# Patient Record
Sex: Female | Born: 1970 | Hispanic: No | Marital: Single | Smoking: Former smoker
Health system: Southern US, Community
[De-identification: ages and names within clinical notes are randomized; demographics above are authoritative.]

## PROBLEM LIST (undated history)

## (undated) DIAGNOSIS — I10 Essential (primary) hypertension: Secondary | ICD-10-CM

## (undated) DIAGNOSIS — T7840XA Allergy, unspecified, initial encounter: Secondary | ICD-10-CM

## (undated) DIAGNOSIS — K529 Noninfective gastroenteritis and colitis, unspecified: Secondary | ICD-10-CM

## (undated) DIAGNOSIS — K5190 Ulcerative colitis, unspecified, without complications: Secondary | ICD-10-CM

## (undated) DIAGNOSIS — K648 Other hemorrhoids: Secondary | ICD-10-CM

## (undated) DIAGNOSIS — K5090 Crohn's disease, unspecified, without complications: Secondary | ICD-10-CM

## (undated) DIAGNOSIS — D649 Anemia, unspecified: Secondary | ICD-10-CM

## (undated) DIAGNOSIS — K633 Ulcer of intestine: Secondary | ICD-10-CM

## (undated) HISTORY — DX: Essential (primary) hypertension: I10

## (undated) HISTORY — DX: Ulcer of intestine: K63.3

## (undated) HISTORY — PX: TONSILLECTOMY: SUR1361

## (undated) HISTORY — DX: Other hemorrhoids: K64.8

## (undated) HISTORY — DX: Allergy, unspecified, initial encounter: T78.40XA

## (undated) HISTORY — DX: Crohn's disease, unspecified, without complications: K50.90

## (undated) HISTORY — PX: COLONOSCOPY: SHX174

## (undated) HISTORY — DX: Anemia, unspecified: D64.9

## (undated) HISTORY — DX: Ulcerative colitis, unspecified, without complications: K51.90

## (undated) HISTORY — DX: Noninfective gastroenteritis and colitis, unspecified: K52.9

---

## 2013-09-12 ENCOUNTER — Encounter: Payer: Self-pay | Admitting: Internal Medicine

## 2013-10-08 ENCOUNTER — Encounter: Payer: Self-pay | Admitting: Internal Medicine

## 2013-10-09 ENCOUNTER — Ambulatory Visit (INDEPENDENT_AMBULATORY_CARE_PROVIDER_SITE_OTHER): Payer: BC Managed Care – PPO | Admitting: Internal Medicine

## 2013-10-09 ENCOUNTER — Encounter: Payer: Self-pay | Admitting: Internal Medicine

## 2013-10-09 VITALS — BP 150/84 | HR 90 | Ht 67.0 in | Wt 157.0 lb

## 2013-10-09 DIAGNOSIS — Z23 Encounter for immunization: Secondary | ICD-10-CM

## 2013-10-09 DIAGNOSIS — K529 Noninfective gastroenteritis and colitis, unspecified: Secondary | ICD-10-CM

## 2013-10-09 DIAGNOSIS — K5289 Other specified noninfective gastroenteritis and colitis: Secondary | ICD-10-CM

## 2013-10-09 DIAGNOSIS — K5010 Crohn's disease of large intestine without complications: Secondary | ICD-10-CM

## 2013-10-09 MED ORDER — PNEUMOCOCCAL VAC POLYVALENT 25 MCG/0.5ML IJ INJ: 0.5000 mL | INJECTION | Freq: Once | INTRAMUSCULAR | Status: AC

## 2013-10-09 MED ORDER — MESALAMINE 1.2 G PO TBEC: 4.8000 g | DELAYED_RELEASE_TABLET | Freq: Every day | ORAL | Status: DC

## 2013-10-09 NOTE — Progress Notes (Signed)
Patient ID: Julie Diaz, female   DOB: 01-Sep-1971, 42 y.o.   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 She is here alone today. She reports she was diagnosed in 2001, initially was ulcerative colitis. At this time she was quite ill with abdominal pain, bloody diarrhea, joint pains, erythema nodosum, and anemia.  She recalls being treated initially with Asacol and prednisone, eventually with methotrexate, and then most recently with Lialda 4.8 g daily. She reports improvement initially was slow it took several cycles of prednisone, but over the last 5-6 years she has done well.   She reports currently she is feeling normal with out symptoms of colitis. She denies abdominal pain. No diarrhea or constipation. No blood in her stool or melena. No joint pains, eye complaints, skin rashes, or oral ulcers. No nausea or vomiting. Appetite is good. No weight loss. Energy level is good. She works in Programmer, applications for [employer].  she did move from [place] where she was cared for by Dr. Mosie Epstein, MD.  Her last colonoscopy was in May of 2014  Past Medical History  Diagnosis Date  . Ulcerative colitis   . Crohn's disease     ?    Past Surgical History  Procedure Laterality Date  . Tonsillectomy      Current Outpatient Prescriptions  Medication Sig Dispense Refill  . Cholecalciferol (VITAMIN D) 2000 UNITS tablet Take 2,000 Units by mouth daily.      Marland Kitchen lisinopril (PRINIVIL,ZESTRIL) 10 MG tablet Take 10 mg by mouth daily.      . mesalamine (LIALDA) 1.2 G EC tablet Take 4.8 g by mouth daily with breakfast.      . norethindrone (MICRONOR,CAMILA,ERRIN) 0.35 MG tablet Take 1 tablet by mouth daily.       No current facility-administered medications for this visit.    No Known Allergies  Family History  Problem Relation Age of Onset  . Breast  cancer Paternal Grandmother   . Prostate cancer Maternal Grandfather     History  Substance Use Topics  . Smoking status: Never Smoker   . Smokeless tobacco: Never Used  . Alcohol Use: Yes     Comment: occasional    ROS: As per history of present illness, otherwise negative  BP 150/84  Pulse 90  Ht 5' 7"  (1.702 m)  Wt 157 lb (71.215 kg)  BMI 24.58 kg/m2  LMP 09/24/2013 Constitutional: Well-developed and well-nourished. No distress. HEENT: Normocephalic and atraumatic. Oropharynx is clear and moist. No oropharyngeal exudate. Conjunctivae are normal.  No scleral icterus. Neck: Neck supple. Trachea midline. Cardiovascular: Normal rate, regular rhythm and intact distal pulses. No M/R/G Pulmonary/chest: Effort normal and breath sounds normal. No wheezing, rales or rhonchi. Abdominal: Soft, nontender, nondistended. Bowel sounds active throughout. There are no masses palpable. No hepatosplenomegaly. Extremities: no clubbing, cyanosis, or edema Lymphadenopathy: No cervical adenopathy noted. Neurological: Alert and oriented to person place and time. Skin: Skin is warm and dry. No rashes noted. Psychiatric: Normal mood and affect. Behavior is normal.  RELEVANT LABS AND Procedures Colonoscopy 03/07/2013 -- diffuse mild colitis with nodular erosions, greatest in the right colon. TI normal. Consistent with IBD, favor Crohn's. Mild mucosal scarring in the rectum. Multiple biopsies from the whole colon.  Pathology = cecum normal, ascending colon inactive chronic colitis, transverse colon inactive chronic colitis, descending colon mildly active  chronic colitis, sigmoid colon mildly active chronic colitis, rectum inactive chronic proctitis.  Labs dated 02/03/2013 -- WBC 7.0, hemoglobin 11.4, hematocrit 36.6, MCV 93, platelet count 347 Hepatic function panel within normal limits Vitamin D, 25 =33.4 C. 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  IBD, indeterminate, most likely Crohn's colitis -- she is here today to establish care for her known history of inflammatory bowel disease. Her disease continues to be in clinical remission on Lialda 4.8 g daily. We will continue Lialda 4.8 g daily going forward. We reviewed the colonoscopy from last May together which showed mostly inactive chronic colitis though there was some mild disease activity in the left colon. Given her clinical remission, we will not escalate therapy at this time. I recommended surveillance colonoscopy with biopsies every 10 cm every 1-2 years. Given that she had some mild disease activity we will target one year with a repeat colonoscopy in May or June of 2015.  We will repeat labs in April 2015, one year from her previous lab studies. --She has received flu vaccine already this year --She has never had Pneumovax, and will be given today --Bone density scan performed 2012 -- no prednisone since this scan, therefore will not repeat at this time. She is not yet menopausal --Labs in April 2015--CMP, CBC, ferritin, CRP, B12, vitamin D  Return in one year, sooner if necessary. Colonoscopy planned for June 2015

## 2013-10-09 NOTE — Patient Instructions (Signed)
We have sent the following medications to your pharmacy for you to pick up at your convenience: continue taking Lialda 4.8g daily  You are due for lab in April 2015. You can stop down to our lab in early April at your convenience to have them drawn.  You are due to a colonoscopy in June 2015.  We will mail you out a letter when the date draws closer to remind you.   You were given a Pneumovax today                                               We are excited to introduce MyChart, a new best-in-class service that provides you online access to important information in your electronic medical record. We want to make it easier for you to view your health information - all in one secure location - when and where you need it. We expect MyChart will enhance the quality of care and service we provide.  When you register for MyChart, you can:    View your test results.    Request appointments and receive appointment reminders via email.    Request medication renewals.    View your medical history, allergies, medications and immunizations.    Communicate with your physician's office through a password-protected site.    Conveniently print information such as your medication lists.  To find out if MyChart is right for you, please talk to a member of our clinical staff today. We will gladly answer your questions about this free health and wellness tool.  If you are age 41 or older and want a member of your family to have access to your record, you must provide written consent by completing a proxy form available at our office. Please speak to our clinical staff about guidelines regarding accounts for patients younger than age 2.  As you activate your MyChart account and need any technical assistance, please call the MyChart technical support line at [phone]) or email your question to [email or web address]. If you email your question(s), please include your name, a return  phone number and the best time to reach you.  If you have non-urgent health-related questions, you can send a message to our office through [place] at [email or web address]. If you have a medical emergency, call 911.  Thank you for using MyChart as your new health and wellness resource!   MyChart licensed from [employer],  1999-2010. Patents Pending.

## 2013-12-08 ENCOUNTER — Other Ambulatory Visit: Payer: Self-pay | Admitting: Internal Medicine

## 2014-03-08 ENCOUNTER — Other Ambulatory Visit: Payer: Self-pay | Admitting: Internal Medicine

## 2014-03-26 ENCOUNTER — Other Ambulatory Visit (INDEPENDENT_AMBULATORY_CARE_PROVIDER_SITE_OTHER): Payer: BC Managed Care – PPO

## 2014-03-26 DIAGNOSIS — K5289 Other specified noninfective gastroenteritis and colitis: Secondary | ICD-10-CM

## 2014-03-26 DIAGNOSIS — K529 Noninfective gastroenteritis and colitis, unspecified: Secondary | ICD-10-CM

## 2014-03-26 LAB — HIGH SENSITIVITY CRP: CRP HIGH SENSITIVITY: 0.8 mg/L (ref 0.000–5.000)

## 2014-03-26 LAB — COMPREHENSIVE METABOLIC PANEL
ALBUMIN: 4.1 g/dL (ref 3.5–5.2)
ALT: 37 U/L — AB (ref 0–35)
AST: 23 U/L (ref 0–37)
Alkaline Phosphatase: 65 U/L (ref 39–117)
BUN: 11 mg/dL (ref 6–23)
CALCIUM: 9.6 mg/dL (ref 8.4–10.5)
CHLORIDE: 106 meq/L (ref 96–112)
CO2: 27 mEq/L (ref 19–32)
Creatinine, Ser: 0.7 mg/dL (ref 0.4–1.2)
GFR: 93.61 mL/min (ref >60.00)
Glucose, Bld: 92 mg/dL (ref 70–99)
Potassium: 3.6 mEq/L (ref 3.5–5.1)
SODIUM: 139 meq/L (ref 135–145)
TOTAL PROTEIN: 7.2 g/dL (ref 6.0–8.3)
Total Bilirubin: 0.8 mg/dL (ref 0.2–1.2)

## 2014-03-26 LAB — CBC
HCT: 39.1 % (ref 36.0–46.0)
HEMOGLOBIN: 12.9 g/dL (ref 12.0–15.0)
MCHC: 32.8 g/dL (ref 30.0–36.0)
MCV: 96.2 fl (ref 78.0–100.0)
Platelets: 286 10*3/uL (ref 150.0–400.0)
RBC: 4.07 Mil/uL (ref 3.87–5.11)
RDW: 13.5 % (ref 11.5–15.5)
WBC: 11.4 10*3/uL — AB (ref 4.0–10.5)

## 2014-03-26 LAB — VITAMIN B12: Vitamin B-12: 567 pg/mL (ref 211–911)

## 2014-03-26 LAB — FERRITIN: Ferritin: 131.4 ng/mL (ref 10.0–291.0)

## 2014-03-27 LAB — VITAMIN D 25 HYDROXY (VIT D DEFICIENCY, FRACTURES): Vit D, 25-Hydroxy: 62 ng/mL (ref 30–89)

## 2014-04-08 ENCOUNTER — Other Ambulatory Visit: Payer: Self-pay | Admitting: Internal Medicine

## 2014-04-09 ENCOUNTER — Encounter: Payer: Self-pay | Admitting: Internal Medicine

## 2014-04-10 ENCOUNTER — Ambulatory Visit (INDEPENDENT_AMBULATORY_CARE_PROVIDER_SITE_OTHER): Payer: BC Managed Care – PPO | Admitting: Internal Medicine

## 2014-04-10 ENCOUNTER — Encounter: Payer: Self-pay | Admitting: Internal Medicine

## 2014-04-10 VITALS — BP 152/90 | HR 88 | Ht 67.0 in | Wt 150.1 lb

## 2014-04-10 DIAGNOSIS — K529 Noninfective gastroenteritis and colitis, unspecified: Secondary | ICD-10-CM

## 2014-04-10 DIAGNOSIS — K5010 Crohn's disease of large intestine without complications: Secondary | ICD-10-CM

## 2014-04-10 DIAGNOSIS — K5289 Other specified noninfective gastroenteritis and colitis: Secondary | ICD-10-CM

## 2014-04-10 MED ORDER — MESALAMINE 1.2 G PO TBEC: DELAYED_RELEASE_TABLET | ORAL | Status: DC

## 2014-04-10 MED ORDER — PREPOPIK 10-3.5-12 MG-GM-GM PO PACK: 1.0000 | PACK | ORAL | Status: DC

## 2014-04-10 NOTE — Patient Instructions (Signed)
You have been scheduled for a colonoscopy with propofol. Please follow written instructions given to you at your visit today.  Please pick up your prep kit at the pharmacy within the next 1-3 days. If you use inhalers (even only as needed), please bring them with you on the day of your procedure. Your physician has requested that you go to www.startemmi.com and enter the access code given to you at your visit today. This web site gives a general overview about your procedure. However, you should still follow specific instructions given to you by our office regarding your preparation for the procedure.  We have sent the following medications to your pharmacy for you to pick up at your convenience: Lialda 4 tablets daily  CC:Dr Glendale Chard

## 2014-04-10 NOTE — Progress Notes (Signed)
Subjective:    Patient ID: Lana Fish, female    DOB: 1971-02-08, 43 y.o.   MRN: [id number]  HPI Yamira Papa is a 43 year old female with a past medical history of pan colonic Crohn's who is seen for followup. She was diagnosed in 2001, initially as ulcerative colitis. She has been on Lialda 4.8 g daily for sometime. Today she reports she is doing well. She denies abdominal pain. She is having 1-2 formed brown stools daily without blood in her stool or melena. She reports a good appetite without nausea or vomiting. No heartburn. No hepatobiliary complaints. She denies oral ulcers, eye pain, rashes or joint symptoms. She has intentionally lost about 7 pounds which she is happy with. She does take daily vitamin D and also lisinopril for hypertension.   Review of Systems As per history of present illness, otherwise negative  Current Medications, Allergies, Past Medical History, Past Surgical History, Family History and Social History were reviewed in [employer] [employer] record.     Objective:   Physical Exam BP 152/90  Pulse 88  Ht 5' 7"  (1.702 m)  Wt 150 lb 2 oz (68.096 kg)  BMI 23.51 kg/m2  LMP 03/14/2014 Constitutional: Well-developed and well-nourished. No distress. HEENT: Normocephalic and atraumatic. Oropharynx is clear and moist. No oropharyngeal exudate. Conjunctivae are normal.  No scleral icterus. Neck: Neck supple. Trachea midline. Cardiovascular: Normal rate, regular rhythm and intact distal pulses. No M/R/G Pulmonary/chest: Effort normal and breath sounds normal. No wheezing, rales or rhonchi. Abdominal: Soft, nontender, nondistended. Bowel sounds active throughout. There are no masses palpable. No hepatosplenomegaly. Extremities: no clubbing, cyanosis, or edema Lymphadenopathy: No cervical adenopathy noted. Neurological: Alert and oriented to person place and time. Skin: Skin is warm and dry. No rashes noted. Psychiatric: Normal mood and affect. Behavior  is normal.  CBC    Component Value Date/Time   WBC 11.4* 03/26/2014 0928   RBC 4.07 03/26/2014 0928   HGB 12.9 03/26/2014 0928   HCT 39.1 03/26/2014 0928   PLT 286.0 03/26/2014 0928   MCV 96.2 03/26/2014 0928   MCHC 32.8 03/26/2014 0928   RDW 13.5 03/26/2014 0928    CMP     Component Value Date/Time   NA 139 03/26/2014 0928   K 3.6 03/26/2014 0928   CL 106 03/26/2014 0928   CO2 27 03/26/2014 0928   GLUCOSE 92 03/26/2014 0928   BUN 11 03/26/2014 0928   CREATININE 0.7 03/26/2014 0928   CALCIUM 9.6 03/26/2014 0928   PROT 7.2 03/26/2014 0928   ALBUMIN 4.1 03/26/2014 0928   AST 23 03/26/2014 0928   ALT 37* 03/26/2014 0928   ALKPHOS 65 03/26/2014 0928   BILITOT 0.8 03/26/2014 0928    Iron/TIBC/Ferritin    Component Value Date/Time   FERRITIN 131.4 03/26/2014 0928   B12 normal Vit D normal    Assessment & Plan:  43 yo female with PMH of pancolonic Crohn's disease currently in clinical remission who is seen for followup.  1. Crohn's colitis, pan -- she is in clinical remission on Lialda 4.8 g daily. We'll continue this dose and 11 refills will be given. I have recommended surveillance colonoscopy at this time. Her last colonoscopy was May 2014, where she did have mildly active chronic colitis in the descending colon, and sigmoid colon. The remainder of the colon biopsies revealed inactive colitis. We discussed colonoscopy including the risks and benefits and she is agreeable to proceed. She had a difficult time with MoviPrep in the past and  I will prescribe Prepopik today.   --Up to date with annual flu vaccine and had Pneumovax at last visit --Bone mineral density scan performed in 2012, without the need for subsequent steroids --Recent labs reviewed  Return for colonoscopy, and to clinic in one year, sooner if necessary

## 2014-04-19 ENCOUNTER — Encounter: Payer: Self-pay | Admitting: Internal Medicine

## 2014-04-25 ENCOUNTER — Other Ambulatory Visit: Payer: Self-pay | Admitting: Internal Medicine

## 2014-04-25 DIAGNOSIS — E049 Nontoxic goiter, unspecified: Secondary | ICD-10-CM

## 2014-05-09 ENCOUNTER — Ambulatory Visit (AMBULATORY_SURGERY_CENTER): Payer: BC Managed Care – PPO | Admitting: Internal Medicine

## 2014-05-09 ENCOUNTER — Encounter: Payer: Self-pay | Admitting: Internal Medicine

## 2014-05-09 VITALS — BP 146/83 | HR 72 | Temp 98.4°F | Resp 28 | Ht 67.0 in | Wt 150.0 lb

## 2014-05-09 DIAGNOSIS — K5289 Other specified noninfective gastroenteritis and colitis: Secondary | ICD-10-CM

## 2014-05-09 DIAGNOSIS — K5010 Crohn's disease of large intestine without complications: Secondary | ICD-10-CM

## 2014-05-09 DIAGNOSIS — D126 Benign neoplasm of colon, unspecified: Secondary | ICD-10-CM

## 2014-05-09 MED ORDER — SODIUM CHLORIDE 0.9 % IV SOLN: 500.0000 mL | INTRAVENOUS | Status: DC

## 2014-05-09 NOTE — Progress Notes (Signed)
Report to PACU, RN, vss, BBS= Clear.  

## 2014-05-09 NOTE — Op Note (Signed)
[facility]  [address] [place], [address]   COLONOSCOPY PROCEDURE REPORT  PATIENT: Julie, Diaz  MR#: [id number] BIRTHDATE: 12/07/1970 , 42  yrs. old GENDER: Female ENDOSCOPIST: Jerene Bears, MD PROCEDURE DATE:  05/09/2014 PROCEDURE:   Colonoscopy with biopsy First Screening Colonoscopy - Avg.  risk and is 50 yrs.  old or older - No.  Prior Negative Screening - Now for repeat screening. N/A  History of Adenoma - Now for follow-up colonoscopy & has been > or = to 3 yrs.  N/A  Polyps Removed Today? No.  Recommend repeat exam, <10 yrs? Yes.  High risk (family or personal hx). ASA CLASS:   Class II INDICATIONS:elevated risk screening and High risk patient with previously diagnosed colonic Crohn's disease 8+ years duration. MEDICATIONS: MAC sedation, administered by CRNA and propofol (Diprivan) 566m IV  DESCRIPTION OF PROCEDURE:   After the risks benefits and alternatives of the procedure were thoroughly explained, informed consent was obtained.  A digital rectal exam revealed no rectal mass.   The LB [id number]-H190 [id number] endoscope was introduced through the anus and advanced to the terminal ileum which was intubated for a short distance. No adverse events experienced.   The quality of the prep was Prepopik fair  The instrument was then slowly withdrawn as the colon was fully examined.   COLON FINDINGS: The mucosa appeared normal in the terminal ileum. Mild colitis was found at the cecum, ascending colon, and proximal transverse colon.  The mucosa was nodular, congested and had erosions and granularity.  This was consistent with Crohn's disease.  Multiple biopsies were performed using cold forceps in 4 quadrant fashion every 10 cm.   The colonic mucosa appeared normal in the distal transverse colon, descending colon, sigmoid colon, and rectum.  Multiple biopsies were performed in 4 quadrant fashion every 10 cm (throughout the entire colon).  Retroflexed  views revealed hypertrophied anal papilla. The time to cecum=6 minutes 34 seconds.  Withdrawal time=18 minutes 34 seconds.  The scope was withdrawn and the procedure completed.  COMPLICATIONS: There were no complications.    ENDOSCOPIC IMPRESSION: 1.   Normal mucosa in the terminal ileum 2.  Mildly active colitis in the cecum, ascending colon, and proximal transverse colon; Four-quadrant biopsies performed every 10 cm using cold forceps for surveillance 3.   The colonic mucosa appeared normal in the distal transverse colon, descending colon, sigmoid colon, and rectum; four-quadrant biopsies performed every 10 cm using cold forceps for surveillance  RECOMMENDATIONS: 1.  Await biopsy results 2.  Avoid all NSAIDS for the next 2 weeks. 3.  Continue Lialda 4.8 g daily   eSigned:  JJerene Bears MD 05/09/2014 2:33 PM   cc: The Patient and RGlendale Chard MD   PATIENT NAME:  SShamara, SozaMR#: [id number]

## 2014-05-09 NOTE — Progress Notes (Signed)
YOU HAD AN ENDOSCOPIC PROCEDURE TODAY AT THE [facility]: Refer to the procedure report that was given to you for any specific questions about what was found during the examination.  If the procedure report does not answer your questions, please call your gastroenterologist to clarify.  If you requested that your care partner not be given the details of your procedure findings, then the procedure report has been included in a sealed envelope for you to review at your convenience later.  YOU SHOULD EXPECT: Some feelings of bloating in the abdomen. Passage of more gas than usual.  Walking can help get rid of the air that was put into your GI tract during the procedure and reduce the bloating. If you had a lower endoscopy (such as a colonoscopy or flexible sigmoidoscopy) you may notice spotting of blood in your stool or on the toilet paper. If you underwent a bowel prep for your procedure, then you may not have a normal bowel movement for a few days.  DIET: Your first meal following the procedure should be a light meal and then it is ok to progress to your normal diet.  A half-sandwich or bowl of soup is an example of a good first meal.  Heavy or fried foods are harder to digest and may make you feel nauseous or bloated.  Likewise meals heavy in dairy and vegetables can cause extra gas to form and this can also increase the bloating.  Drink plenty of fluids but you should avoid alcoholic beverages for 24 hours.  ACTIVITY: Your care partner should take you home directly after the procedure.  You should plan to take it easy, moving slowly for the rest of the day.  You can resume normal activity the day after the procedure however you should NOT DRIVE or use heavy machinery for 24 hours (because of the sedation medicines used during the test).    SYMPTOMS TO REPORT IMMEDIATELY: A gastroenterologist can be reached at any hour.  During normal business hours, 8:30 AM to 5:00 PM Monday through Friday,  call [phone].  After hours and on weekends, please call the GI answering service at [phone] who will take a message and have the physician on call contact you.   Following lower endoscopy (colonoscopy or flexible sigmoidoscopy):  Excessive amounts of blood in the stool  Significant tenderness or worsening of abdominal pains  Swelling of the abdomen that is new, acute  Fever of 100F or higher  Following upper endoscopy (EGD)  Vomiting of blood or coffee ground material  New chest pain or pain under the shoulder blades  Painful or persistently difficult swallowing  New shortness of breath  Fever of 100F or higher  Black, tarry-looking stools  FOLLOW UP: If any biopsies were taken you will be contacted by phone or by letter within the next 1-3 weeks.  Call your gastroenterologist if you have not heard about the biopsies in 3 weeks.  Our staff will call the home number listed on your records the next business day following your procedure to check on you and address any questions or concerns that you may have at that time regarding the information given to you following your procedure. This is a courtesy call and so if there is no answer at the home number and we have not heard from you through the emergency physician on call, we will assume that you have returned to your regular daily activities without incident.  SIGNATURES/CONFIDENTIALITY: You and/or your care   partner have signed paperwork which will be entered into your electronic medical record.  These signatures attest to the fact that that the information above on your After Visit Summary has been reviewed and is understood.  Full responsibility of the confidentiality of this discharge information lies with you and/or your care-partner.  

## 2014-05-09 NOTE — Progress Notes (Signed)
Called to room to assist during endoscopic procedure.  Patient ID and intended procedure confirmed with present staff. Received instructions for my participation in the procedure from the performing physician.  

## 2014-05-09 NOTE — Progress Notes (Signed)
Pt. Asked when she could restart breast feeding.  Was advise to begin tomorrow by Dr. Hilarie Fredrickson and [facility] Monday CRNA.

## 2014-05-09 NOTE — Patient Instructions (Addendum)
YOU HAD AN ENDOSCOPIC PROCEDURE TODAY AT [facility]: Refer to the procedure report that was given to you for any specific questions about what was found during the examination.  If the procedure report does not answer your questions, please call your gastroenterologist to clarify.  If you requested that your care partner not be given the details of your procedure findings, then the procedure report has been included in a sealed envelope for you to review at your convenience later.  YOU SHOULD EXPECT: Some feelings of bloating in the abdomen. Passage of more gas than usual.  Walking can help get rid of the air that was put into your GI tract during the procedure and reduce the bloating. If you had a lower endoscopy (such as a colonoscopy or flexible sigmoidoscopy) you may notice spotting of blood in your stool or on the toilet paper. If you underwent a bowel prep for your procedure, then you may not have a normal bowel movement for a few days.  DIET: Your first meal following the procedure should be a light meal and then it is ok to progress to your normal diet.  A half-sandwich or bowl of soup is an example of a good first meal.  Heavy or fried foods are harder to digest and may make you feel nauseous or bloated.  Likewise meals heavy in dairy and vegetables can cause extra gas to form and this can also increase the bloating.  Drink plenty of fluids but you should avoid alcoholic beverages for 24 hours.  ACTIVITY: Your care partner should take you home directly after the procedure.  You should plan to take it easy, moving slowly for the rest of the day.  You can resume normal activity the day after the procedure however you should NOT DRIVE or use heavy machinery for 24 hours (because of the sedation medicines used during the test).    SYMPTOMS TO REPORT IMMEDIATELY: A gastroenterologist can be reached at any hour.  During normal business hours, 8:30 AM to 5:00 PM Monday through Friday,  call [phone].  After hours and on weekends, please call the GI answering service at [phone] who will take a message and have the physician on call contact you.   Following lower endoscopy (colonoscopy or flexible sigmoidoscopy):  Excessive amounts of blood in the stool  Significant tenderness or worsening of abdominal pains  Swelling of the abdomen that is new, acute  Fever of 100F or higher    FOLLOW UP: If any biopsies were taken you will be contacted by phone or by letter within the next 1-3 weeks.  Call your gastroenterologist if you have not heard about the biopsies in 3 weeks.  Our staff will call the home number listed on your records the next business day following your procedure to check on you and address any questions or concerns that you may have at that time regarding the information given to you following your procedure. This is a courtesy call and so if there is no answer at the home number and we have not heard from you through the emergency physician on call, we will assume that you have returned to your regular daily activities without incident.  SIGNATURES/CONFIDENTIALITY: You and/or your care partner have signed paperwork which will be entered into your electronic medical record.  These signatures attest to the fact that that the information above on your After Visit Summary has been reviewed and is understood.  Full responsibility of the confidentiality  of this discharge information lies with you and/or your care-partner.  Await biopsy results.    Avoid all NSAIDS for 2 weeks.  Continue Lialda daily.

## 2014-05-10 ENCOUNTER — Other Ambulatory Visit: Payer: BC Managed Care – PPO

## 2014-05-10 ENCOUNTER — Telehealth: Payer: Self-pay | Admitting: *Deleted

## 2014-05-10 NOTE — Telephone Encounter (Signed)
  Follow up Call-  Call back number 05/09/2014  Post procedure Call Back phone  # [phone]  Permission to leave phone message Yes     Patient questions:  Do you have a fever, pain , or abdominal swelling? No. Pain Score  0 *  Have you tolerated food without any problems? Yes.    Have you been able to return to your normal activities? Yes.    Do you have any questions about your discharge instructions: Diet   No. Medications  No. Follow up visit  No.  Do you have questions or concerns about your Care? No.  Actions: * If pain score is 4 or above: No action needed, pain <4.

## 2014-05-16 ENCOUNTER — Encounter: Payer: Self-pay | Admitting: Internal Medicine

## 2014-05-17 ENCOUNTER — Telehealth: Payer: Self-pay | Admitting: Internal Medicine

## 2014-05-17 NOTE — Telephone Encounter (Signed)
Patient notified of appt for 07/16/14 2:45 recommended by Dr. Hilarie Fredrickson

## 2014-05-29 ENCOUNTER — Ambulatory Visit
Admission: RE | Admit: 2014-05-29 | Discharge: 2014-05-29 | Disposition: A | Discharge status: Home / Self Care | Payer: BC Managed Care – PPO | Source: Ambulatory Visit | Attending: Internal Medicine | Admitting: Internal Medicine

## 2014-05-29 ENCOUNTER — Encounter (INDEPENDENT_AMBULATORY_CARE_PROVIDER_SITE_OTHER): Payer: Self-pay

## 2014-05-29 DIAGNOSIS — E049 Nontoxic goiter, unspecified: Secondary | ICD-10-CM

## 2014-07-16 ENCOUNTER — Ambulatory Visit (INDEPENDENT_AMBULATORY_CARE_PROVIDER_SITE_OTHER): Payer: BC Managed Care – PPO | Admitting: Internal Medicine

## 2014-07-16 ENCOUNTER — Encounter: Payer: Self-pay | Admitting: Internal Medicine

## 2014-07-16 VITALS — BP 150/72 | HR 68 | Ht 67.0 in | Wt 151.0 lb

## 2014-07-16 DIAGNOSIS — K5010 Crohn's disease of large intestine without complications: Secondary | ICD-10-CM

## 2014-07-16 DIAGNOSIS — Z23 Encounter for immunization: Secondary | ICD-10-CM

## 2014-07-16 DIAGNOSIS — K50119 Crohn's disease of large intestine with unspecified complications: Secondary | ICD-10-CM

## 2014-07-16 NOTE — Progress Notes (Signed)
Subjective:    Patient ID: Julie Diaz, female    DOB: [birth year], 43 y.o.   MRN: [id number]  HPI Zowie Lundahl is a 43 yo female with PMH of pancolonic Crohn's disease diagnosed in 2001 who is here for followup. She came for colonoscopy which was performed on 05/09/2014. This revealed mild colitis in the cecum, ascending colon and proximal transverse colon. The remaining colon was unremarkable as was the terminal ileum. Biopsies from the right colon revealed mildly active chronic colitis without dysplasia. Left colon and rectum biopsies were benign without evidence of active or chronic inflammation. No dysplasia. She has been maintained only on a 4.8 g daily. She reports she is feeling very well. No complaints. She is having one to 2 stools per day which are formed. No evidence of bleeding or melena. No abdominal pain. No upper GI complaints including heartburn, dysphagia or odynophagia. No hepatobiliary complaints. Good appetite. No fevers or chills. No oral ulcers or joint pains. She does state previously when her disease was most active she had wrist and knee pain.   Review of Systems As per history of present illness, otherwise negative  Current Medications, Allergies, Past Medical History, Past Surgical History, Family History and Social History were reviewed in [employer] record.     Objective:   Physical Exam BP 150/72  Pulse 68  Ht 5' 7"  (1.702 m)  Wt 151 lb (68.493 kg)  BMI 23.64 kg/m2  LMP 06/25/2014 Constitutional: Well-developed and well-nourished. No distress. HEENT: Normocephalic and atraumatic. Oropharynx is clear and moist. No oropharyngeal exudate. Conjunctivae are normal.  No scleral icterus. Neck: Neck supple. Trachea midline. Cardiovascular: Normal rate, regular rhythm and intact distal pulses. No M/R/G Pulmonary/chest: Effort normal and breath sounds normal. No wheezing, rales or rhonchi. Abdominal: Soft, nontender, nondistended. Bowel sounds  active throughout. There are no masses palpable. No hepatosplenomegaly. Extremities: no clubbing, cyanosis, or edema Lymphadenopathy: No cervical adenopathy noted. Neurological: Alert and oriented to person place and time. Skin: Skin is warm and dry. No rashes noted. Psychiatric: Normal mood and affect. Behavior is normal.  CBC    Component Value Date/Time   WBC 11.4* 03/26/2014 0928   RBC 4.07 03/26/2014 0928   HGB 12.9 03/26/2014 0928   HCT 39.1 03/26/2014 0928   PLT 286.0 03/26/2014 0928   MCV 96.2 03/26/2014 0928   MCHC 32.8 03/26/2014 0928   RDW 13.5 03/26/2014 0928    CMP     Component Value Date/Time   NA 139 03/26/2014 0928   K 3.6 03/26/2014 0928   CL 106 03/26/2014 0928   CO2 27 03/26/2014 0928   GLUCOSE 92 03/26/2014 0928   BUN 11 03/26/2014 0928   CREATININE 0.7 03/26/2014 0928   CALCIUM 9.6 03/26/2014 0928   PROT 7.2 03/26/2014 0928   ALBUMIN 4.1 03/26/2014 0928   AST 23 03/26/2014 0928   ALT 37* 03/26/2014 0928   ALKPHOS 65 03/26/2014 0928   BILITOT 0.8 03/26/2014 0928   Colonoscopy from July 2015 reviewed with the patient including pathology    Assessment & Plan:   42 yo female with PMH of pancolonic Crohn's disease diagnosed in 2001 who is here for followup.  1. Colonic Crohn's disease -- she has maintained clinical remission despite the fact that she had mild endoscopic disease in the right colon from January 2015. She continues Lialda 4.8 g daily which we will continue. We had a long discussion today regarding escalation of therapy to achieve mucosal healing versus monitoring  on current therapy. We discussed azathioprine versus biologic therapy and the risks associated with these medicines. At present, given that she is feeling very well, she has not required steroids or had active flare in some time we will continue with 5 ASA therapy only. I think this is a very reasonable decision. Should she develop symptoms requiring steroid therapy, my recommendation would be for escalation of therapy at that  time. She will need surveillance colonoscopy every 2 years --Flu shot today, previous Pneumovax given --Bone mineral density scan in 2012, no steroids since --Recent labs reviewed --Followup in one year, sooner if necessary

## 2014-07-16 NOTE — Patient Instructions (Signed)
Continue Lialda, you may come by and pick up a new copay card for Lialda when we get those in.   You have received a flu shot today. Follow up in 1 year.

## 2015-03-04 ENCOUNTER — Other Ambulatory Visit: Payer: Self-pay

## 2015-03-04 DIAGNOSIS — Z1231 Encounter for screening mammogram for malignant neoplasm of breast: Secondary | ICD-10-CM

## 2015-04-10 ENCOUNTER — Ambulatory Visit
Admission: RE | Admit: 2015-04-10 | Discharge: 2015-04-10 | Disposition: A | Discharge status: Home / Self Care | Payer: BC Managed Care – PPO | Source: Ambulatory Visit

## 2015-04-10 DIAGNOSIS — Z1231 Encounter for screening mammogram for malignant neoplasm of breast: Secondary | ICD-10-CM

## 2015-04-19 ENCOUNTER — Other Ambulatory Visit: Payer: Self-pay | Admitting: Internal Medicine

## 2015-05-02 ENCOUNTER — Encounter: Payer: Self-pay | Admitting: Internal Medicine

## 2015-07-23 ENCOUNTER — Other Ambulatory Visit: Payer: Self-pay | Admitting: *Deleted

## 2015-07-23 ENCOUNTER — Other Ambulatory Visit: Payer: Self-pay | Admitting: Obstetrics and Gynecology

## 2015-07-23 MED ORDER — MESALAMINE 1.2 G PO TBEC: DELAYED_RELEASE_TABLET | ORAL | Status: DC

## 2015-07-24 LAB — CYTOLOGY - PAP

## 2015-07-30 ENCOUNTER — Ambulatory Visit (INDEPENDENT_AMBULATORY_CARE_PROVIDER_SITE_OTHER): Payer: BC Managed Care – PPO | Admitting: Internal Medicine

## 2015-07-30 ENCOUNTER — Encounter: Payer: Self-pay | Admitting: Internal Medicine

## 2015-07-30 VITALS — BP 132/80 | HR 64 | Ht 67.0 in | Wt 155.0 lb

## 2015-07-30 DIAGNOSIS — K5010 Crohn's disease of large intestine without complications: Secondary | ICD-10-CM

## 2015-07-30 MED ORDER — VSL#3 DS PO PACK: 1.0000 | PACK | Freq: Every day | ORAL | Status: DC

## 2015-07-30 NOTE — Progress Notes (Signed)
Subjective:    Patient ID: Julie Diaz, female    DOB: 12/07/1970, 44 y.o.   MRN: [id number]  HPI Julie Diaz is a 44 year old female with history of pancolonic Crohn's disease diagnosed in 2001 is here for follow-up. She was last seen in September 2015. She had a colonoscopy on 05/09/2014 which revealed mild colitis in the cecum ascending colon and proximal transverse colon. The remaining colon was unremarkable as was the terminal ileum. Biopsy showed mildly active chronic colitis without dysplasia in the right colon, left colon and rectum biopsies were benign with no active or chronic colitis. She's been maintained on mesalamine 4.8 g daily in the form of Lialda.  She reports overall she has been doing well though several months ago she developed abdominal bloating and constipation symptoms having bowel movements every 2-3 days. During this time she was having considerable hot flashes without sweating. She contacted her GYN and was told this was not menopausal related. She does take OCP uninterrupted. During this time she was having occasional lower abdominal cramping pain. Her hot flashes have since stopped. Currently bowel movements are occurring once daily and are soft. No blood. She has the sense that things are not completely normal with her colitis but she doesn't fill on well. It is not prompted her to call for an earlier appointment or miss work. Appetite has been good. No nausea or vomiting. No rash. 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  She reports she had labs with primary care several months ago and these were normal.  Review of Systems As per history of present illness, otherwise negative  Current Medications, Allergies, Past Medical History, Past Surgical History, Family History and Social History were reviewed in [employer] record.     Objective:   Physical Exam BP 132/80  mmHg  Pulse 64  Ht 5' 7"  (1.702 m)  Wt 155 lb (70.308 kg)  BMI 24.27 kg/m2  LMP 07/13/2015 Constitutional: Well-developed and well-nourished. No distress. HEENT: Normocephalic and atraumatic. Oropharynx is clear and moist. No oropharyngeal exudate. Conjunctivae are normal.  No scleral icterus. Neck: Neck supple. Trachea midline. Cardiovascular: Normal rate, regular rhythm and intact distal pulses. No M/R/G Pulmonary/chest: Effort normal and breath sounds normal. No wheezing, rales or rhonchi. Abdominal: Soft, nontender, nondistended. Bowel sounds active throughout. There are no masses palpable. No hepatosplenomegaly. Extremities: no clubbing, cyanosis, or edema Lymphadenopathy: No cervical adenopathy noted. Neurological: Alert and oriented to person place and time. Skin: Skin is warm and dry. No rashes noted. Psychiatric: Normal mood and affect. Behavior is normal.     Assessment & Plan:  44 year old female with history of pancolonic Crohn's disease diagnosed in 2001 is here for follow-up.  1. Crohn's colitis diagnosed 2001 -- she did have mild endoscopic and histologic disease activity in the right colon last summer at colonoscopy. At present time I do do not feel symptoms warrant escalation of therapy to biologic. Bowel habits have been somewhat erratic and she may have had a flare of her disease for several weeks a few months ago. Symptoms seem to have normalized. I'm going to add VSL No. 3 double strength packet 1 packet daily given her colitis. Hopefully with the addition of this probiotic the inflammation will completely resolve. She is due surveillance colonoscopy next summer. I asked that she notify me if symptoms don't improve totally or certainly if she worsens prior to follow-up. She voices understanding. Flu shot recommended  and she will receive this at work next week. Bone mineral density study last year was normal. --One year follow-up, sooner if necessary  25 min spent with  her today

## 2015-07-30 NOTE — Patient Instructions (Signed)
Please continue Lialda 4 tablets daily.  Call our office if at any point you are not doing well.  We have sent the following medications to your pharmacy for you to pick up at your convenience: VSL DS packets once daily  Please follow up with Dr Hilarie Fredrickson in 1 year.

## 2015-08-04 ENCOUNTER — Telehealth: Payer: Self-pay | Admitting: Internal Medicine

## 2015-08-04 MED ORDER — MESALAMINE 1.2 G PO TBEC: DELAYED_RELEASE_TABLET | ORAL | Status: DC

## 2015-08-04 NOTE — Telephone Encounter (Signed)
Barrett the drug rep for VSL reportedly has a way to help with IBD colitis coverage with the DS dosing  This is an FDA approved indication and dose

## 2015-08-04 NOTE — Telephone Encounter (Signed)
Lialda sent to patient's pharmacy. Dr Hilarie Fredrickson, patient's insurance will not cover VSL #3 DS. She has the same healthplan as the other patient that we have had no luck getting coverage for. I gave her a coupon card here at the office and explained that some insurances do not cover this. Any other suggestions?

## 2015-08-08 ENCOUNTER — Encounter: Payer: Self-pay | Admitting: *Deleted

## 2015-08-08 NOTE — Telephone Encounter (Signed)
I have sent additional note to [employer] [employer] in hopes that VSL # DS will be approved.

## 2015-08-18 ENCOUNTER — Encounter: Payer: Self-pay | Admitting: Internal Medicine

## 2015-08-19 NOTE — Telephone Encounter (Signed)
Julie Diaz Patient with colitis having trouble getting VSL Will you call her to let her know her options Thank you

## 2016-02-16 ENCOUNTER — Other Ambulatory Visit: Payer: Self-pay | Admitting: Internal Medicine

## 2016-04-21 ENCOUNTER — Other Ambulatory Visit (INDEPENDENT_AMBULATORY_CARE_PROVIDER_SITE_OTHER): Payer: BC Managed Care – PPO

## 2016-04-21 ENCOUNTER — Ambulatory Visit (INDEPENDENT_AMBULATORY_CARE_PROVIDER_SITE_OTHER): Payer: BC Managed Care – PPO | Admitting: Internal Medicine

## 2016-04-21 ENCOUNTER — Ambulatory Visit: Payer: BC Managed Care – PPO | Admitting: Internal Medicine

## 2016-04-21 ENCOUNTER — Encounter: Payer: Self-pay | Admitting: Internal Medicine

## 2016-04-21 VITALS — BP 120/68 | HR 72 | Ht 67.0 in | Wt 158.0 lb

## 2016-04-21 DIAGNOSIS — K5010 Crohn's disease of large intestine without complications: Secondary | ICD-10-CM

## 2016-04-21 LAB — HIGH SENSITIVITY CRP: CRP HIGH SENSITIVITY: 1.75 mg/L (ref 0.000–5.000)

## 2016-04-21 LAB — COMPREHENSIVE METABOLIC PANEL
ALBUMIN: 4.4 g/dL (ref 3.5–5.2)
ALK PHOS: 76 U/L (ref 39–117)
ALT: 17 U/L (ref 0–35)
AST: 16 U/L (ref 0–37)
BILIRUBIN TOTAL: 0.4 mg/dL (ref 0.2–1.2)
BUN: 11 mg/dL (ref 6–23)
CO2: 29 mEq/L (ref 19–32)
CREATININE: 0.68 mg/dL (ref 0.40–1.20)
Calcium: 10.5 mg/dL (ref 8.4–10.5)
Chloride: 105 mEq/L (ref 96–112)
GFR: 99.06 mL/min (ref >60.00)
Glucose, Bld: 112 mg/dL — ABNORMAL HIGH (ref 70–99)
Potassium: 3.6 mEq/L (ref 3.5–5.1)
SODIUM: 138 meq/L (ref 135–145)
TOTAL PROTEIN: 7.5 g/dL (ref 6.0–8.3)

## 2016-04-21 LAB — CBC WITH DIFFERENTIAL/PLATELET
BASOS ABS: 0.1 10*3/uL (ref 0.0–0.1)
Basophils Relative: 0.9 % (ref 0.0–3.0)
EOS ABS: 0.2 10*3/uL (ref 0.0–0.7)
EOS PCT: 1.8 % (ref 0.0–5.0)
HCT: 36 % (ref 36.0–46.0)
HEMOGLOBIN: 11.9 g/dL — AB (ref 12.0–15.0)
Lymphocytes Relative: 21 % (ref 12.0–46.0)
Lymphs Abs: 2 10*3/uL (ref 0.7–4.0)
MCHC: 33.1 g/dL (ref 30.0–36.0)
MCV: 93.8 fl (ref 78.0–100.0)
MONO ABS: 0.6 10*3/uL (ref 0.1–1.0)
Monocytes Relative: 5.7 % (ref 3.0–12.0)
Neutro Abs: 6.8 10*3/uL (ref 1.4–7.7)
Neutrophils Relative %: 70.6 % (ref 43.0–77.0)
Platelets: 313 10*3/uL (ref 150.0–400.0)
RBC: 3.84 Mil/uL — AB (ref 3.87–5.11)
RDW: 13.3 % (ref 11.5–15.5)
WBC: 9.6 10*3/uL (ref 4.0–10.5)

## 2016-04-21 MED ORDER — GLYCOPYRROLATE 2 MG PO TABS: 2.0000 mg | ORAL_TABLET | Freq: Two times a day (BID) | ORAL | Status: DC | PRN

## 2016-04-21 NOTE — Progress Notes (Signed)
Patient ID: Julie Diaz, female   DOB: [birth year], 45 y.o.   MRN: [id number] HPI: Julie Diaz is a 45 year old female with history of pancolonic Crohn's disease diagnosed in 2001 who is here for follow-up. She was last seen in October 2016. Her last colonoscopy was July 2015 showing mild active colitis in the right colon. Terminal ileum and left colon were without active/chronic inflammation. She has been maintained on Lialda 4.8 g daily.  She reports that for the most part she is doing well. In the winter she had 1-2 weeks of what she considers to be a mild flare. This was associated with aching abdominal pain and looser stools with bloating. This resolved on its own without her notify my office or change in therapy. Currently her stools are formed without blood or melena. Frequency is once per day. She denies abdominal pain. No nausea or vomiting. No upper GI complaints such as dysphagia or odynophagia and or hepatobiliary complaint. Appetite is good. Weight has been stable. No rash. No ocular pain. No new joint pains. We attempted VSL No. 3 but insurance did not cover. She does report maybe 1 day every 2-3 months developing mid and lower abdominal cramping. This can last one to 2 hours and be moderate in intensity. No definite triggers for this.  Past Medical History  Diagnosis Date  . Ulcerative colitis ([place])   . Crohn's disease ([place])     ?  Marland Kitchen Allergy     SEASONAL  . Anemia   . Hypertension   . Colitis     Past Surgical History  Procedure Laterality Date  . Tonsillectomy    . Colonoscopy      Outpatient Prescriptions Prior to Visit  Medication Sig Dispense Refill  . Cholecalciferol (VITAMIN D) 2000 UNITS tablet Take 2,000 Units by mouth daily.    Marland Kitchen LIALDA 1.2 g EC tablet TAKE 4 TABLETS BY MOUTH EVERY DAY WITH BREAKFAST 120 tablet 3  . lisinopril (PRINIVIL,ZESTRIL) 10 MG tablet Take 10 mg by mouth daily.    . Multiple Vitamin (MULTIVITAMIN) capsule Take 1 capsule by mouth daily.    .  norethindrone (MICRONOR,CAMILA,ERRIN) 0.35 MG tablet Take 1 tablet by mouth daily.    . Probiotic Product (VSL#3 DS) PACK Take 1 each by mouth daily. VSL #3 DS- crohns colitis k50.119 30 each 10   Facility-Administered Medications Prior to Visit  Medication Dose Route Frequency Provider Last Rate Last Dose  . pneumococcal 23 valent vaccine (PNU-IMMUNE) injection 0.5 mL  0.5 mL Intramuscular Once Jerene Bears, MD        No Known Allergies  Family History  Problem Relation Age of Onset  . Breast cancer Paternal Grandmother   . Prostate cancer Maternal Grandfather   . Hypertension Mother   . Diabetes Father   . Hypertension Father     Social History  Substance Use Topics  . Smoking status: Never Smoker   . Smokeless tobacco: Never Used  . Alcohol Use: Yes     Comment: occasional    ROS: As per history of present illness, otherwise negative  BP 120/68 mmHg  Pulse 72  Ht 5' 7"  (1.702 m)  Wt 158 lb (71.668 kg)  BMI 24.74 kg/m2 Constitutional: Well-developed and well-nourished. No distress. HEENT: Normocephalic and atraumatic. Oropharynx is clear and moist. No oropharyngeal exudate. Conjunctivae are normal.  No scleral icterus. Neck: Neck supple. Trachea midline. Cardiovascular: Normal rate, regular rhythm and intact distal pulses. No M/R/G Pulmonary/chest: Effort normal and breath sounds normal.  No wheezing, rales or rhonchi. Abdominal: Soft, very mild diffuse tenderness without rebound or guarding (subjective), nondistended. Bowel sounds active throughout. There are no masses palpable. No hepatosplenomegaly. Extremities: no clubbing, cyanosis, or edema Lymphadenopathy: No cervical adenopathy noted. Neurological: Alert and oriented to person place and time. Skin: Skin is warm and dry. No rashes noted. Psychiatric: Normal mood and affect. Behavior is normal.  ASSESSMENT/PLAN: 45 year old female with history of pancolonic Crohn's disease diagnosed in 2001 who is here for  follow-up.  1. Crohn's colitis, diagnosis 2001-- currently clinically she is doing well. She did have mild endoscopic disease at last colonoscopy. She is due surveillance colonoscopy at this time. I have recommended surveillance colonoscopy and we discussed the risks, benefits and alternatives and she is agreeable to proceed. She is requested to call back and schedule this exam. Continue Lialda 4.8 g daily. Change in treatment if indicated to be based on results of colonoscopy. For her intermittent abdominal cramping I will give her a trial of Robinul Forte 2 mg twice daily as needed. Up-to-date on vaccination in bone density studies. Check CBC, CMP and CRP today.  One-year follow-up 25 minutes spent with the patient today. Greater than 50% was spent in counseling and coordination of care with the patient      KP:VVZSM Sanders, [address] [place], [place] [id number]

## 2016-04-21 NOTE — Patient Instructions (Signed)
It has been recommended to you by your physician that you have a(n) colonoscopy completed for surveillance of your Crohns. Per your request, we did not schedule the procedure(s) today. Please contact our office at [phone] should you decide to have the procedure completed.  We have sent the following medications to your pharmacy for you to pick up at your convenience: Robinul 2 mg every 12 hours as needed for lower abdominal cramping  Your physician has requested that you go to the basement for the following lab work before leaving today: CRP, CMP, CBC  Continue Lialda 4.8 grams daily.  If you are age 36 or older, your body mass index should be between 23-30. Your Body mass index is 24.74 kg/(m^2). If this is out of the aforementioned range listed, please consider follow up with your Primary Care Provider.  If you are age 32 or younger, your body mass index should be between 19-25. Your Body mass index is 24.74 kg/(m^2). If this is out of the aformentioned range listed, please consider follow up with your Primary Care Provider.

## 2016-05-04 ENCOUNTER — Encounter: Payer: Self-pay | Admitting: Internal Medicine

## 2016-06-17 ENCOUNTER — Encounter: Payer: Self-pay | Admitting: Internal Medicine

## 2016-06-17 ENCOUNTER — Ambulatory Visit (AMBULATORY_SURGERY_CENTER): Payer: Self-pay

## 2016-06-17 VITALS — Ht 67.0 in | Wt 156.2 lb

## 2016-06-17 DIAGNOSIS — K5010 Crohn's disease of large intestine without complications: Secondary | ICD-10-CM

## 2016-06-17 MED ORDER — MOVIPREP 100 G PO SOLR: 1.0000 | Freq: Once | ORAL | 0 refills | Status: AC

## 2016-06-17 NOTE — Progress Notes (Signed)
No allergies to eggs or soy No past problems with anesthesia No diet meds No home oxygen  Has internet; declined emmi

## 2016-06-19 ENCOUNTER — Other Ambulatory Visit: Payer: Self-pay | Admitting: Internal Medicine

## 2016-07-01 ENCOUNTER — Encounter: Payer: Self-pay | Admitting: Internal Medicine

## 2016-07-01 ENCOUNTER — Ambulatory Visit (AMBULATORY_SURGERY_CENTER): Payer: BC Managed Care – PPO | Admitting: Internal Medicine

## 2016-07-01 VITALS — BP 124/69 | HR 79 | Temp 97.5°F | Resp 14 | Ht 67.0 in | Wt 156.0 lb

## 2016-07-01 DIAGNOSIS — K529 Noninfective gastroenteritis and colitis, unspecified: Secondary | ICD-10-CM

## 2016-07-01 DIAGNOSIS — Z1211 Encounter for screening for malignant neoplasm of colon: Secondary | ICD-10-CM | POA: Diagnosis not present

## 2016-07-01 DIAGNOSIS — K5010 Crohn's disease of large intestine without complications: Secondary | ICD-10-CM | POA: Diagnosis not present

## 2016-07-01 MED ORDER — SODIUM CHLORIDE 0.9 % IV SOLN: 500.0000 mL | INTRAVENOUS | Status: AC

## 2016-07-01 NOTE — Progress Notes (Signed)
Called to room to assist during endoscopic procedure.  Patient ID and intended procedure confirmed with present staff. Received instructions for my participation in the procedure from the performing physician.  

## 2016-07-01 NOTE — Op Note (Signed)
[facility] Patient Name: Julie Diaz Procedure Date: 07/01/2016 8:33 AM MRN: [id number] Endoscopist: Jerene Bears , MD Age: 45 Referring MD:  Date of Birth: [birth year] Gender: Female Account #: [id number] Procedure:                Colonoscopy Indications:              High risk colon cancer surveillance: Crohn's                            colitis of 8 (or more) years duration, Last                            colonoscopy: July 2015 Medicines:                Monitored Anesthesia Care Procedure:                Pre-Anesthesia Assessment:                           - Prior to the procedure, a History and Physical                            was performed, and patient medications and                            allergies were reviewed. The patient's tolerance of                            previous anesthesia was also reviewed. The risks                            and benefits of the procedure and the sedation                            options and risks were discussed with the patient.                            All questions were answered, and informed consent                            was obtained. Prior Anticoagulants: The patient has                            taken no previous anticoagulant or antiplatelet                            agents. ASA Grade Assessment: II - A patient with                            mild systemic disease. After reviewing the risks                            and benefits, the patient was deemed in  satisfactory condition to undergo the procedure.                           After obtaining informed consent, the colonoscope                            was passed under direct vision. Throughout the                            procedure, the patient's blood pressure, pulse, and                            oxygen saturations were monitored continuously. The                            Model PCF-H190DL [id number]) scope was introduced                            through the anus and advanced to the the terminal                            ileum. The colonoscopy was performed without                            difficulty. The patient tolerated the procedure                            well. The quality of the bowel preparation was good                            after copious irrigation and lavage. The terminal                            ileum, ileocecal valve, appendiceal orifice, and                            rectum were photographed. Scope In: 8:47:22 AM Scope Out: 9:04:42 AM Scope Withdrawal Time: 0 hours 14 minutes 21 seconds  Total Procedure Duration: 0 hours 17 minutes 20 seconds  Findings:                 The perianal exam findings include skin tags.                           The terminal ileum contained a few aphthae.                           Inflammation characterized by altered vascularity,                            congestion (edema), erosions, erythema, friability                            and granularity was found in a continuous and  circumferential pattern from the transverse colon                            to the cecum. This was mild to moderate in                            severity, and when compared to previous                            examinations, the findings are somewhat worsened.                            Four biopsies were taken every 10 cm with a cold                            forceps from the entire colon for Crohn's disease                            surveillance and dysplasia surveillance. These                            biopsy specimens from the right colon and left                            colon were sent to Pathology.                           The rectum, sigmoid colon and descending colon                            appeared normal.                           Internal hemorrhoids were found during                            retroflexion. The hemorrhoids  were small. Complications:            No immediate complications. Estimated Blood Loss:     Estimated blood loss was minimal. Impression:               - Aphtha in the terminal ileum.                           - Crohn's disease. Inflammation was found from the                            transverse colon to the cecum. This was mild to                            moderate in severity. Biopsied.                           - The rectum, sigmoid colon and descending colon  are normal. Biopsied.                           - Internal hemorrhoids. Recommendation:           - Patient has a contact number available for                            emergencies. The signs and symptoms of potential                            delayed complications were discussed with the                            patient. Return to normal activities tomorrow.                            Written discharge instructions were provided to the                            patient.                           - Resume previous diet.                           - Continue present medications.                           - Await pathology results before decisions about                            change/escalation in therapy.                           - Repeat colonoscopy in 2 years for surveillance. Jerene Bears, MD 07/01/2016 9:13:30 AM This report has been signed electronically.

## 2016-07-01 NOTE — Progress Notes (Signed)
To pacu vss patent aw reprot to rn

## 2016-07-01 NOTE — Patient Instructions (Signed)
Discharge instructions given. Handout on hemorrhoids. Resume previous medications. YOU HAD AN ENDOSCOPIC PROCEDURE TODAY AT [facility]:   Refer to the procedure report that was given to you for any specific questions about what was found during the examination.  If the procedure report does not answer your questions, please call your gastroenterologist to clarify.  If you requested that your care partner not be given the details of your procedure findings, then the procedure report has been included in a sealed envelope for you to review at your convenience later.  YOU SHOULD EXPECT: Some feelings of bloating in the abdomen. Passage of more gas than usual.  Walking can help get rid of the air that was put into your GI tract during the procedure and reduce the bloating. If you had a lower endoscopy (such as a colonoscopy or flexible sigmoidoscopy) you may notice spotting of blood in your stool or on the toilet paper. If you underwent a bowel prep for your procedure, you may not have a normal bowel movement for a few days.  Please Note:  You might notice some irritation and congestion in your nose or some drainage.  This is from the oxygen used during your procedure.  There is no need for concern and it should clear up in a day or so.  SYMPTOMS TO REPORT IMMEDIATELY:   Following lower endoscopy (colonoscopy or flexible sigmoidoscopy):  Excessive amounts of blood in the stool  Significant tenderness or worsening of abdominal pains  Swelling of the abdomen that is new, acute  Fever of 100F or higher   For urgent or emergent issues, a gastroenterologist can be reached at any hour by calling [phone].   DIET:  We do recommend a small meal at first, but then you may proceed to your regular diet.  Drink plenty of fluids but you should avoid alcoholic beverages for 24 hours.  ACTIVITY:  You should plan to take it easy for the rest of today and you should NOT DRIVE or use heavy  machinery until tomorrow (because of the sedation medicines used during the test).    FOLLOW UP: Our staff will call the number listed on your records the next business day following your procedure to check on you and address any questions or concerns that you may have regarding the information given to you following your procedure. If we do not reach you, we will leave a message.  However, if you are feeling well and you are not experiencing any problems, there is no need to return our call.  We will assume that you have returned to your regular daily activities without incident.  If any biopsies were taken you will be contacted by phone or by letter within the next 1-3 weeks.  Please call us at [phone] if you have not heard about the biopsies in 3 weeks.    SIGNATURES/CONFIDENTIALITY: You and/or your care partner have signed paperwork which will be entered into your electronic medical record.  These signatures attest to the fact that that the information above on your After Visit Summary has been reviewed and is understood.  Full responsibility of the confidentiality of this discharge information lies with you and/or your care-partner.

## 2016-07-02 ENCOUNTER — Telehealth: Payer: Self-pay | Admitting: *Deleted

## 2016-07-02 NOTE — Telephone Encounter (Signed)
  Follow up Call-  Call back number 07/01/2016 05/09/2014  Post procedure Call Back phone  # [phone]  Permission to leave phone message Yes Yes  Some recent data might be hidden     Patient questions:  Do you have a fever, pain , or abdominal swelling? No. Pain Score  0 *  Have you tolerated food without any problems? Yes.    Have you been able to return to your normal activities? Yes.    Do you have any questions about your discharge instructions: Diet   No. Medications  No. Follow up visit  No.  Do you have questions or concerns about your Care? No.  Actions: * If pain score is 4 or above: No action needed, pain <4.

## 2016-07-06 IMAGING — US US SOFT TISSUE HEAD/NECK
1 series · 14 of 25 positions shown · non-contrast
Comparison: None.

CLINICAL DATA: GOITER

EXAM:
THYROID ULTRASOUND
TECHNIQUE: Ultrasound examination of the thyroid gland and adjacent soft
tissues was performed.

[Series 1: us soft tissue head/neck · 0.07mm/px · 14 of 37 slices shown]
[im 1/37]
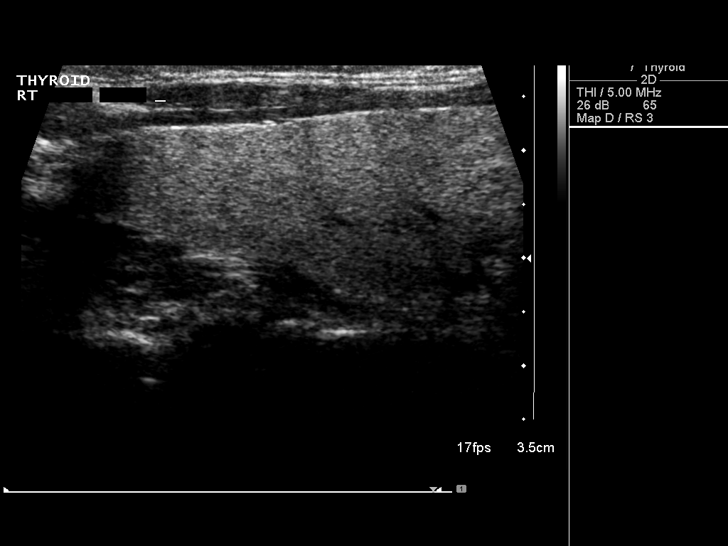
[im 4/37]
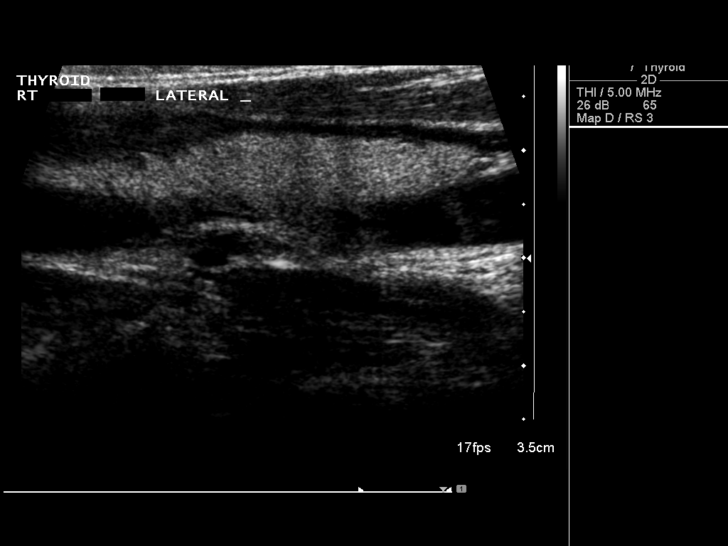
[im 7/37]
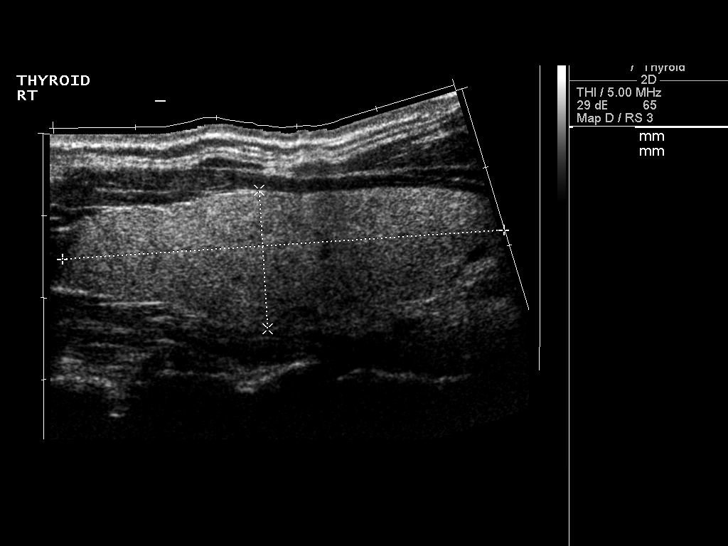
[im 10/37]
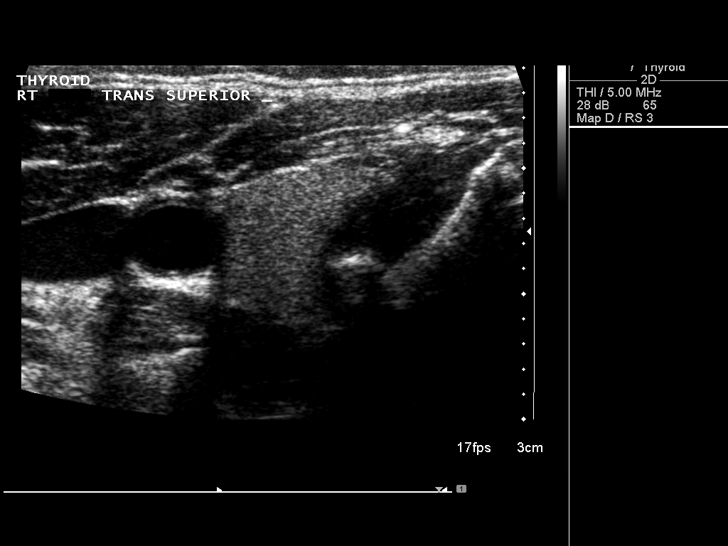
[im 13/37]
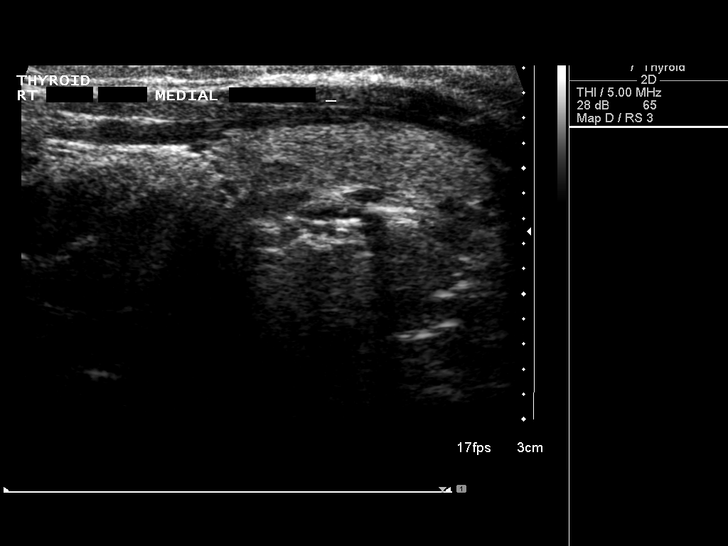
[im 14/37]
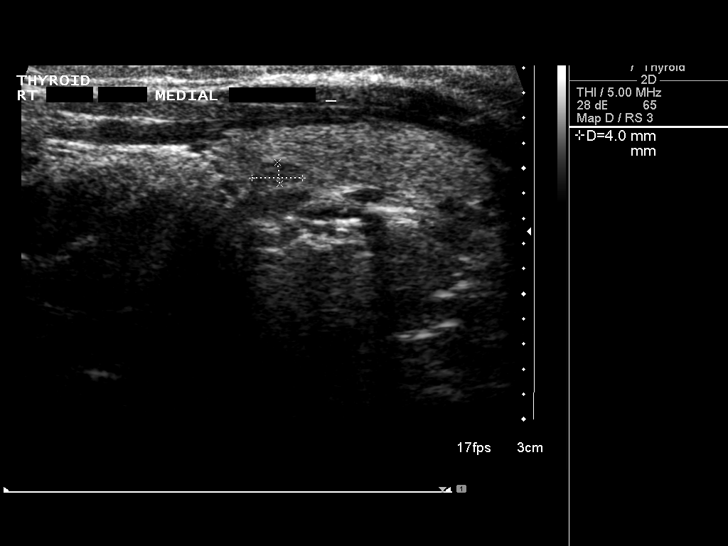
[im 17/37]
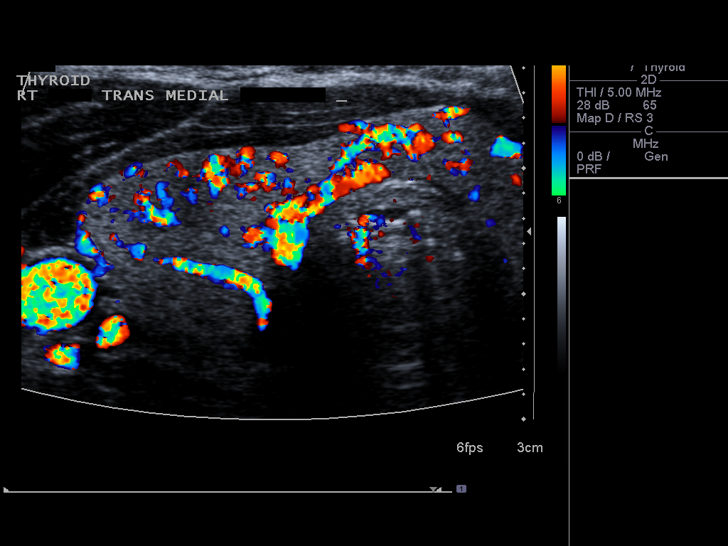
[im 20/37]
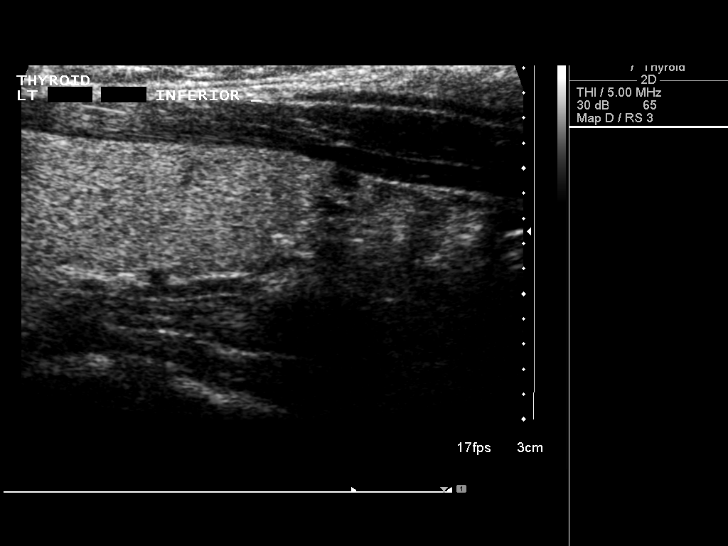
[im 23/37]
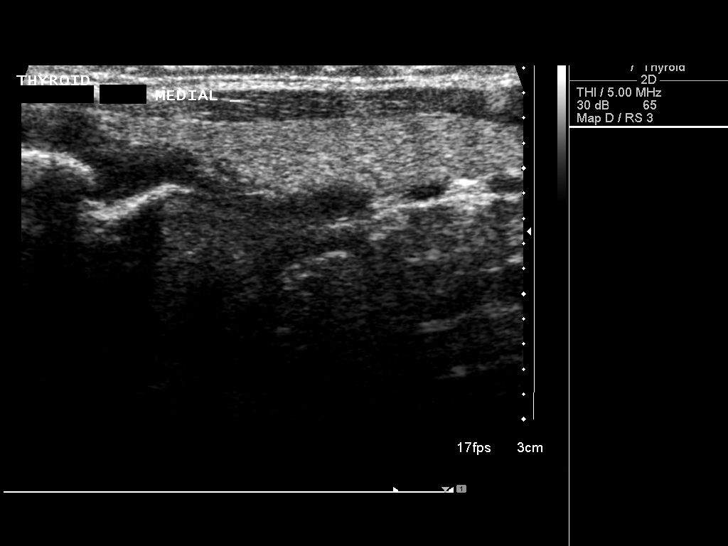
[im 25/37]
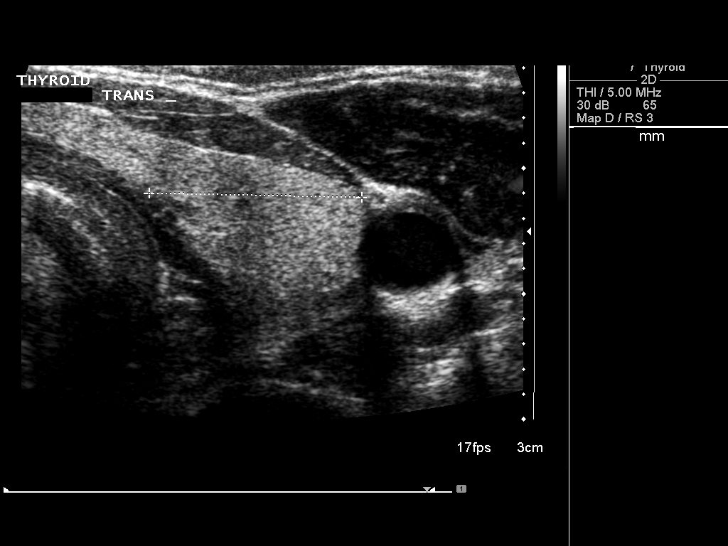
[im 28/37]
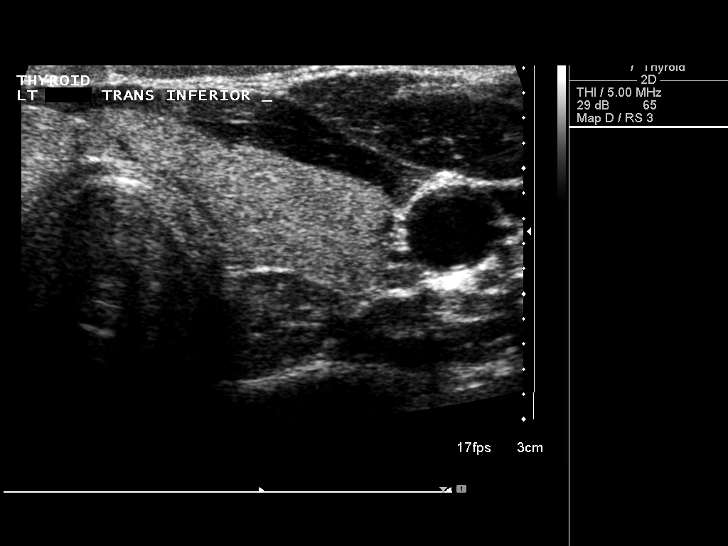
[im 31/37]
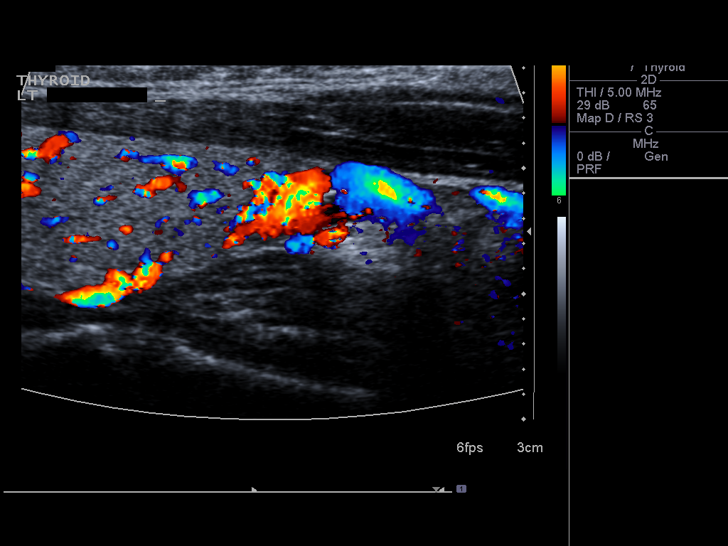
[im 34/37]
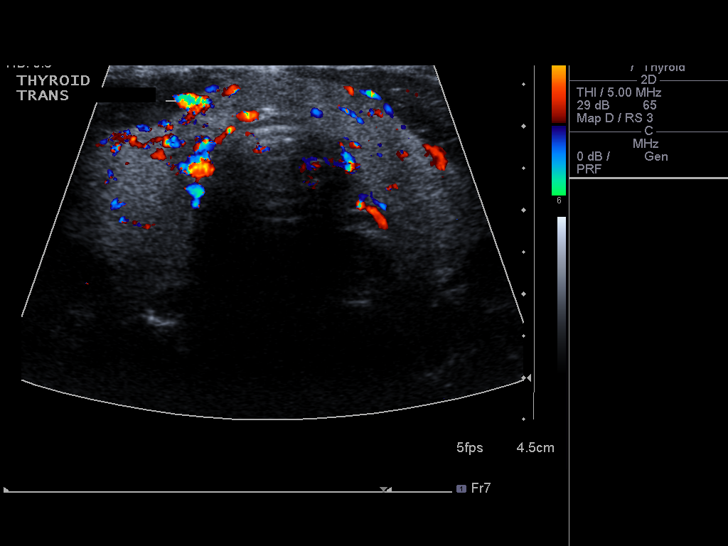
[im 37/37]
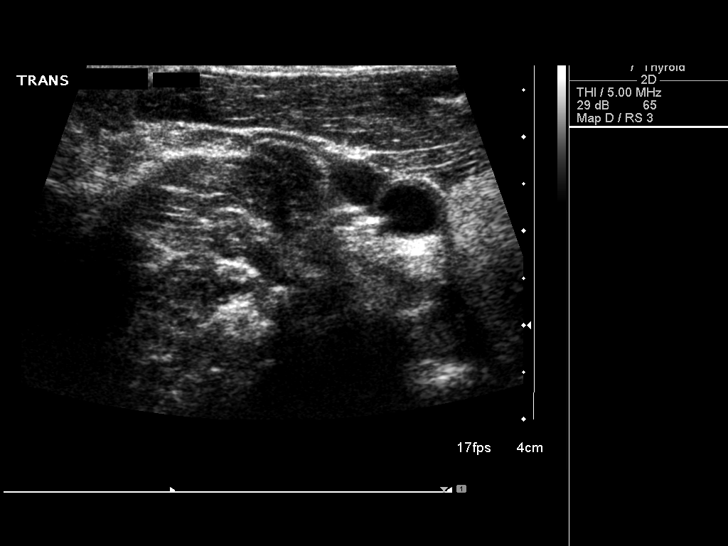

[14 of 25 positions shown; findings below may reference images not displayed]

FINDINGS: Right thyroid lobe

Measurements: 54 x 17 x 19 mm. 4 x 2 x 3 mm hypoechoic nodule near
the junction with the isthmus.

Left thyroid lobe

Measurements: 47 x 13 x 17 mm.  No nodules visualized.

Isthmus

Thickness: 5 mm.  No nodules visualized.

Lymphadenopathy

None visualized.
IMPRESSION: 1. Mild thyromegaly with a single small right nodule. Findings do
not meet current consensus criteria for biopsy. Follow-up by
clinical exam is recommended. If patient has known risk factors for
thyroid carcinoma, consider follow-up ultrasound in 12 months. If
patient is clinically hyperthyroid, consider nuclear medicine
thyroid uptake and scan. This recommendation follows the consensus
statement: Management of Thyroid Nodules Detected as US: Society of
Radiologists in Ultrasound Consensus Conference Statement. Radiology
3550; [DATE]. The

## 2016-07-13 ENCOUNTER — Encounter: Payer: Self-pay | Admitting: Internal Medicine

## 2016-09-01 ENCOUNTER — Encounter: Payer: Self-pay | Admitting: *Deleted

## 2016-09-13 ENCOUNTER — Ambulatory Visit (INDEPENDENT_AMBULATORY_CARE_PROVIDER_SITE_OTHER): Payer: BC Managed Care – PPO | Admitting: Internal Medicine

## 2016-09-13 ENCOUNTER — Encounter: Payer: Self-pay | Admitting: Internal Medicine

## 2016-09-13 ENCOUNTER — Other Ambulatory Visit (INDEPENDENT_AMBULATORY_CARE_PROVIDER_SITE_OTHER): Payer: BC Managed Care – PPO

## 2016-09-13 VITALS — BP 136/80 | HR 100 | Ht 67.0 in | Wt 159.2 lb

## 2016-09-13 DIAGNOSIS — K625 Hemorrhage of anus and rectum: Secondary | ICD-10-CM

## 2016-09-13 DIAGNOSIS — K5010 Crohn's disease of large intestine without complications: Secondary | ICD-10-CM

## 2016-09-13 DIAGNOSIS — K50111 Crohn's disease of large intestine with rectal bleeding: Secondary | ICD-10-CM

## 2016-09-13 DIAGNOSIS — Z79899 Other long term (current) drug therapy: Secondary | ICD-10-CM | POA: Diagnosis not present

## 2016-09-13 LAB — CBC WITH DIFFERENTIAL/PLATELET
BASOS ABS: 0.1 10*3/uL (ref 0.0–0.1)
BASOS PCT: 0.8 % (ref 0.0–3.0)
EOS ABS: 0.1 10*3/uL (ref 0.0–0.7)
Eosinophils Relative: 1.5 % (ref 0.0–5.0)
HEMATOCRIT: 37.8 % (ref 36.0–46.0)
HEMOGLOBIN: 12.6 g/dL (ref 12.0–15.0)
LYMPHS PCT: 26.7 % (ref 12.0–46.0)
Lymphs Abs: 2.4 10*3/uL (ref 0.7–4.0)
MCHC: 33.3 g/dL (ref 30.0–36.0)
MCV: 94.4 fl (ref 78.0–100.0)
MONOS PCT: 4.4 % (ref 3.0–12.0)
Monocytes Absolute: 0.4 10*3/uL (ref 0.1–1.0)
NEUTROS ABS: 6 10*3/uL (ref 1.4–7.7)
Neutrophils Relative %: 66.6 % (ref 43.0–77.0)
Platelets: 320 10*3/uL (ref 150.0–400.0)
RBC: 4.01 Mil/uL (ref 3.87–5.11)
RDW: 13 % (ref 11.5–15.5)
WBC: 9 10*3/uL (ref 4.0–10.5)

## 2016-09-13 LAB — COMPREHENSIVE METABOLIC PANEL
ALBUMIN: 4.3 g/dL (ref 3.5–5.2)
ALK PHOS: 73 U/L (ref 39–117)
ALT: 26 U/L (ref 0–35)
AST: 18 U/L (ref 0–37)
BILIRUBIN TOTAL: 0.3 mg/dL (ref 0.2–1.2)
BUN: 11 mg/dL (ref 6–23)
CALCIUM: 10.3 mg/dL (ref 8.4–10.5)
CO2: 29 mEq/L (ref 19–32)
Chloride: 105 mEq/L (ref 96–112)
Creatinine, Ser: 0.71 mg/dL (ref 0.40–1.20)
GFR: 94.08 mL/min (ref >60.00)
Glucose, Bld: 105 mg/dL — ABNORMAL HIGH (ref 70–99)
Potassium: 3.4 mEq/L — ABNORMAL LOW (ref 3.5–5.1)
Sodium: 142 mEq/L (ref 135–145)
Total Protein: 7.3 g/dL (ref 6.0–8.3)

## 2016-09-13 LAB — HIGH SENSITIVITY CRP: CRP, High Sensitivity: 1.38 mg/L (ref 0.000–5.000)

## 2016-09-13 MED ORDER — MESALAMINE 1.2 G PO TBEC: DELAYED_RELEASE_TABLET | ORAL | 3 refills | Status: DC

## 2016-09-13 NOTE — Patient Instructions (Signed)
Your physician has requested that you go to the basement for the following lab work before leaving today: TPMT, CRP, CBC, CMP  We have sent the following medications to your pharmacy for you to pick up at your convenience: Lialda (AG) mesalamine 4.8 grams daily  Please follow up with Dr Hilarie Fredrickson in 4 months.  If you are age 46 or older, your body mass index should be between 23-30. Your Body mass index is 24.94 kg/m. If this is out of the aforementioned range listed, please consider follow up with your Primary Care Provider.  If you are age 93 or younger, your body mass index should be between 19-25. Your Body mass index is 24.94 kg/m. If this is out of the aformentioned range listed, please consider follow up with your Primary Care Provider.

## 2016-09-13 NOTE — Progress Notes (Signed)
Subjective:    Patient ID: Julie Diaz, female    DOB: 1971/01/13, 45 y.o.   MRN: [id number]  HPI Julie Diaz is a 45 year old female with history of colonic Crohn's disease diagnosed in 2001 is here for follow-up. She was last seen in the office in June 2017 and for surveillance colonoscopy in September 2017. Her colonoscopy showed inflammation from the cecum to transverse colon. This was mild to moderate in severity and was somewhat worsened compared to prior surveillance. Surveillance biopsies were performed. The left colon appeared normal. Internal hemorrhoids were found and small.  Biopsies showed active chronic colitis without dysplasia from the right colon and benign colon mucosa without inflammation, dysplasia or malignancy in the left colon. She is been maintained on Lialda 4.8 g daily.  She reports that symptoms still are not well controlled. This is on an intermittent basis. She describes her symptoms as "annoying" and mild. This involves intermittent abdominal discomfort and crampy pain. Stools are intermittently loose and for the first time she's noticed some blood in her stool. This is happened a very few amount of times. She has not had fever or chills. No rashes. No ocular complaints. No joint pains. No upper GI or hepatobiliary complaint today. She has been consistent with her Lialda.   Review of Systems As per history of present illness, otherwise negative  Current Medications, Allergies, Past Medical History, Past Surgical History, Family History and Social History were reviewed in [facility] record.     Objective:   Physical Exam BP 136/80   Pulse 100   Ht 5' 7"  (1.702 m)   Wt 159 lb 4 oz (72.2 kg)   BMI 24.94 kg/m  Constitutional: Well-developed and well-nourished. No distress. HEENT: Normocephalic and atraumatic. Oropharynx is clear and moist. No oropharyngeal exudate. Conjunctivae are normal.  No scleral icterus. Neck: Neck supple. Trachea  midline. Cardiovascular: Normal rate, regular rhythm and intact distal pulses. No M/R/G Pulmonary/chest: Effort normal and breath sounds normal. No wheezing, rales or rhonchi. Abdominal: Soft, Mild right-sided abdominal tenderness without rebound or guarding, nondistended. Bowel sounds active throughout. There are no masses palpable. No hepatosplenomegaly. Extremities: no clubbing, cyanosis, or edema Lymphadenopathy: No cervical adenopathy noted. Neurological: Alert and oriented to person place and time. Skin: Skin is warm and dry. No rashes noted. Psychiatric: Normal mood and affect. Behavior is normal.  CBC    Component Value Date/Time   WBC 9.6 04/21/2016 0951   RBC 3.84 (L) 04/21/2016 0951   HGB 11.9 (L) 04/21/2016 0951   HCT 36.0 04/21/2016 0951   PLT 313.0 04/21/2016 0951   MCV 93.8 04/21/2016 0951   MCHC 33.1 04/21/2016 0951   RDW 13.3 04/21/2016 0951   LYMPHSABS 2.0 04/21/2016 0951   MONOABS 0.6 04/21/2016 0951   EOSABS 0.2 04/21/2016 0951   BASOSABS 0.1 04/21/2016 0951   CMP     Component Value Date/Time   NA 138 04/21/2016 0951   K 3.6 04/21/2016 0951   CL 105 04/21/2016 0951   CO2 29 04/21/2016 0951   GLUCOSE 112 (H) 04/21/2016 0951   BUN 11 04/21/2016 0951   CREATININE 0.68 04/21/2016 0951   CALCIUM 10.5 04/21/2016 0951   PROT 7.5 04/21/2016 0951   ALBUMIN 4.4 04/21/2016 0951   AST 16 04/21/2016 0951   ALT 17 04/21/2016 0951   ALKPHOS 76 04/21/2016 0951   BILITOT 0.4 04/21/2016 0951   CRP 1.5 - June 2017      Assessment & Plan:  45 year old female  with history of colonic Crohn's disease diagnosed in 2001 is here for follow-up.   1. Crohn's colitis with rectal bleeding -- she has activity in the right colon and symptoms are consistent with this. She is been maintained on 5 ASA therapy and symptoms persist despite this. We will continue Lialda at 4.8 g daily because this has overall helped her. We discussed escalation of therapy given her symptoms. I think  she would be appropriate for azathioprine. We discussed the risks, benefits and alternatives. At this point I do not think she needs biologic therapy. With azathioprine we discussed nausea, idiopathic pancreatitis which could be severe, leukopenia, hepatotoxicity, lymphoma, rash. After this discussion she wishes to proceed. We will need to check a TP MT level prior to starting. I am also checking CRP, CBC and CMP today. She will need hepatic function panel and CBC every 2 weeks for 6 weeks after starting azathioprine therapy. We will plan to dose escalate if tolerated to around 2 mg/kilogram. She will continue Lialda 4.8 g a day and return in 4 months. She knows to call me with any change in symptoms or if the medication causes side effects.  25 minutes spent with the patient today. Greater than 50% was spent in counseling and coordination of care with the patient

## 2016-09-21 ENCOUNTER — Other Ambulatory Visit: Payer: Self-pay

## 2016-09-21 DIAGNOSIS — Z79899 Other long term (current) drug therapy: Secondary | ICD-10-CM

## 2016-09-21 LAB — THIOPURINE METHYLTRANSFERASE (TPMT), RBC: THIOPURINE METHYLTRANSFERASE, RBC: 18 nmol/h/mL

## 2016-09-21 MED ORDER — AZATHIOPRINE 100 MG PO TABS: ORAL_TABLET | ORAL | 2 refills | Status: DC

## 2016-09-23 ENCOUNTER — Other Ambulatory Visit: Payer: Self-pay | Admitting: Obstetrics and Gynecology

## 2016-09-23 DIAGNOSIS — R1011 Right upper quadrant pain: Secondary | ICD-10-CM

## 2016-09-30 ENCOUNTER — Other Ambulatory Visit: Payer: BC Managed Care – PPO

## 2016-10-08 ENCOUNTER — Other Ambulatory Visit: Payer: Self-pay

## 2016-10-08 ENCOUNTER — Other Ambulatory Visit (INDEPENDENT_AMBULATORY_CARE_PROVIDER_SITE_OTHER): Payer: BC Managed Care – PPO

## 2016-10-08 DIAGNOSIS — R945 Abnormal results of liver function studies: Principal | ICD-10-CM

## 2016-10-08 DIAGNOSIS — Z79899 Other long term (current) drug therapy: Secondary | ICD-10-CM | POA: Diagnosis not present

## 2016-10-08 DIAGNOSIS — R7989 Other specified abnormal findings of blood chemistry: Secondary | ICD-10-CM

## 2016-10-08 LAB — CBC WITH DIFFERENTIAL/PLATELET
BASOS PCT: 0.6 % (ref 0.0–3.0)
Basophils Absolute: 0 10*3/uL (ref 0.0–0.1)
EOS PCT: 1.7 % (ref 0.0–5.0)
Eosinophils Absolute: 0.1 10*3/uL (ref 0.0–0.7)
HEMATOCRIT: 37.7 % (ref 36.0–46.0)
HEMOGLOBIN: 12.5 g/dL (ref 12.0–15.0)
LYMPHS PCT: 33.3 % (ref 12.0–46.0)
Lymphs Abs: 2.7 10*3/uL (ref 0.7–4.0)
MCHC: 33.2 g/dL (ref 30.0–36.0)
MCV: 95.4 fl (ref 78.0–100.0)
MONOS PCT: 9.1 % (ref 3.0–12.0)
Monocytes Absolute: 0.7 10*3/uL (ref 0.1–1.0)
Neutro Abs: 4.5 10*3/uL (ref 1.4–7.7)
Neutrophils Relative %: 55.3 % (ref 43.0–77.0)
Platelets: 315 10*3/uL (ref 150.0–400.0)
RBC: 3.95 Mil/uL (ref 3.87–5.11)
RDW: 13 % (ref 11.5–15.5)
WBC: 8.1 10*3/uL (ref 4.0–10.5)

## 2016-10-08 LAB — HEPATIC FUNCTION PANEL
ALT: 14 U/L (ref 0–35)
AST: 15 U/L (ref 0–37)
Albumin: 4.7 g/dL (ref 3.5–5.2)
Alkaline Phosphatase: 61 U/L (ref 39–117)
Bilirubin, Direct: 0.1 mg/dL (ref 0.0–0.3)
TOTAL PROTEIN: 7.6 g/dL (ref 6.0–8.3)
Total Bilirubin: 0.4 mg/dL (ref 0.2–1.2)

## 2016-10-22 ENCOUNTER — Telehealth: Payer: Self-pay | Admitting: Internal Medicine

## 2016-10-22 NOTE — Telephone Encounter (Signed)
Patient is asking if Dr. Hilarie Fredrickson will refer her to acupuncture. Please, advise.

## 2016-10-26 NOTE — Telephone Encounter (Signed)
Spoke with pt and she is aware.

## 2016-10-26 NOTE — Telephone Encounter (Signed)
She may not need a referral, I'm certainly okay with acupuncture therapy I do not have a practice that I have used very often I have heard good things about integr[facility] . [place], [place] [address] [phone] Referral, if needed, can be placed

## 2016-12-23 ENCOUNTER — Other Ambulatory Visit: Payer: Self-pay | Admitting: Internal Medicine

## 2017-02-10 ENCOUNTER — Other Ambulatory Visit: Payer: Self-pay

## 2017-02-10 ENCOUNTER — Telehealth: Payer: Self-pay | Admitting: Internal Medicine

## 2017-02-10 ENCOUNTER — Other Ambulatory Visit (INDEPENDENT_AMBULATORY_CARE_PROVIDER_SITE_OTHER): Payer: BC Managed Care – PPO

## 2017-02-10 DIAGNOSIS — K5090 Crohn's disease, unspecified, without complications: Secondary | ICD-10-CM

## 2017-02-10 LAB — CBC WITH DIFFERENTIAL/PLATELET
BASOS PCT: 1 % (ref 0.0–3.0)
Basophils Absolute: 0.1 10*3/uL (ref 0.0–0.1)
EOS ABS: 0.2 10*3/uL (ref 0.0–0.7)
Eosinophils Relative: 1.9 % (ref 0.0–5.0)
HCT: 36.8 % (ref 36.0–46.0)
Hemoglobin: 12.2 g/dL (ref 12.0–15.0)
Lymphocytes Relative: 36.9 % (ref 12.0–46.0)
Lymphs Abs: 3.9 10*3/uL (ref 0.7–4.0)
MCHC: 33.3 g/dL (ref 30.0–36.0)
MCV: 96.1 fl (ref 78.0–100.0)
MONO ABS: 0.9 10*3/uL (ref 0.1–1.0)
Monocytes Relative: 8.2 % (ref 3.0–12.0)
NEUTROS ABS: 5.4 10*3/uL (ref 1.4–7.7)
Neutrophils Relative %: 52 % (ref 43.0–77.0)
PLATELETS: 318 10*3/uL (ref 150.0–400.0)
RBC: 3.83 Mil/uL — ABNORMAL LOW (ref 3.87–5.11)
RDW: 13.6 % (ref 11.5–15.5)
WBC: 10.5 10*3/uL (ref 4.0–10.5)

## 2017-02-10 LAB — COMPREHENSIVE METABOLIC PANEL
ALK PHOS: 80 U/L (ref 39–117)
ALT: 16 U/L (ref 0–35)
AST: 14 U/L (ref 0–37)
Albumin: 4.3 g/dL (ref 3.5–5.2)
BUN: 14 mg/dL (ref 6–23)
CO2: 29 mEq/L (ref 19–32)
CREATININE: 0.73 mg/dL (ref 0.40–1.20)
Calcium: 10.5 mg/dL (ref 8.4–10.5)
Chloride: 105 mEq/L (ref 96–112)
GFR: 90.95 mL/min (ref >60.00)
GLUCOSE: 132 mg/dL — AB (ref 70–99)
POTASSIUM: 3.4 meq/L — AB (ref 3.5–5.1)
SODIUM: 140 meq/L (ref 135–145)
Total Bilirubin: 0.3 mg/dL (ref 0.2–1.2)
Total Protein: 7.3 g/dL (ref 6.0–8.3)

## 2017-02-10 LAB — C-REACTIVE PROTEIN: CRP: 0.2 mg/dL — ABNORMAL LOW (ref 0.5–20.0)

## 2017-02-10 MED ORDER — AZATHIOPRINE 100 MG PO TABS: 150.0000 mg | ORAL_TABLET | Freq: Every day | ORAL | 2 refills | Status: DC

## 2017-02-10 MED ORDER — BUDESONIDE 9 MG PO TB24: 9.0000 mg | ORAL_TABLET | Freq: Every day | ORAL | 2 refills | Status: DC

## 2017-02-10 NOTE — Telephone Encounter (Signed)
Would have her calm for CBC, CMP and CRP Once those are collected resume azathioprine 150 mg daily Repeat CBC and CMP 2 weeks after reinitiation of azathioprine Continue Lialda 4.8 g daily Begin Uceris 9 mg daily x 8 weeks for active colitis Office follow-up next available if not already scheduled

## 2017-02-10 NOTE — Telephone Encounter (Signed)
Spoke with pt and she is aware, states she will come today around 4pm for the labs. Scripts were sent to the pharmacy. Pt scheduled to see Dr. Hilarie Fredrickson 03/24/17@11am . Orders and reminder in epic for labs.

## 2017-02-10 NOTE — Telephone Encounter (Signed)
Pt states that for the past 2 mths she has had some BRB in her stool with some clots. States it doesn't happen every day but at least every week for several days. She states her joints are sore, no diarrhea. Reports she is taking her lialda but her Imuran refill was denied in March, states not refilled because pt needs labs/ov. Please advise.

## 2017-02-14 ENCOUNTER — Telehealth: Payer: Self-pay | Admitting: *Deleted

## 2017-02-14 MED ORDER — AZATHIOPRINE 50 MG PO TABS: 150.0000 mg | ORAL_TABLET | Freq: Every day | ORAL | 3 refills | Status: DC

## 2017-02-14 NOTE — Telephone Encounter (Signed)
Rx sent 

## 2017-02-24 ENCOUNTER — Telehealth: Payer: Self-pay

## 2017-02-24 NOTE — Telephone Encounter (Signed)
-----   Message from Algernon Huxley, RN sent at 02/10/2017 12:26 PM EDT ----- Regarding: Orders Pt needs labs after starting Imuran in 2 weeks, orders in epic,

## 2017-02-24 NOTE — Telephone Encounter (Signed)
Pt aware.

## 2017-02-25 ENCOUNTER — Other Ambulatory Visit (INDEPENDENT_AMBULATORY_CARE_PROVIDER_SITE_OTHER): Payer: BC Managed Care – PPO

## 2017-02-25 ENCOUNTER — Other Ambulatory Visit: Payer: Self-pay

## 2017-02-25 DIAGNOSIS — R7989 Other specified abnormal findings of blood chemistry: Secondary | ICD-10-CM

## 2017-02-25 DIAGNOSIS — K5090 Crohn's disease, unspecified, without complications: Secondary | ICD-10-CM

## 2017-02-25 DIAGNOSIS — R945 Abnormal results of liver function studies: Principal | ICD-10-CM

## 2017-02-25 LAB — COMPREHENSIVE METABOLIC PANEL
ALK PHOS: 64 U/L (ref 39–117)
ALT: 12 U/L (ref 0–35)
AST: 13 U/L (ref 0–37)
Albumin: 4.3 g/dL (ref 3.5–5.2)
BUN: 14 mg/dL (ref 6–23)
CHLORIDE: 103 meq/L (ref 96–112)
CO2: 30 mEq/L (ref 19–32)
Calcium: 10 mg/dL (ref 8.4–10.5)
Creatinine, Ser: 0.75 mg/dL (ref 0.40–1.20)
GFR: 88.14 mL/min (ref >60.00)
Glucose, Bld: 99 mg/dL (ref 70–99)
POTASSIUM: 3.3 meq/L — AB (ref 3.5–5.1)
SODIUM: 139 meq/L (ref 135–145)
TOTAL PROTEIN: 7.1 g/dL (ref 6.0–8.3)
Total Bilirubin: 0.6 mg/dL (ref 0.2–1.2)

## 2017-02-25 LAB — HEPATIC FUNCTION PANEL
ALBUMIN: 4.3 g/dL (ref 3.5–5.2)
ALT: 12 U/L (ref 0–35)
AST: 13 U/L (ref 0–37)
Alkaline Phosphatase: 64 U/L (ref 39–117)
Bilirubin, Direct: 0.1 mg/dL (ref 0.0–0.3)
Total Bilirubin: 0.6 mg/dL (ref 0.2–1.2)
Total Protein: 7.1 g/dL (ref 6.0–8.3)

## 2017-02-25 LAB — CBC WITH DIFFERENTIAL/PLATELET
BASOS PCT: 0.4 % (ref 0.0–3.0)
Basophils Absolute: 0 10*3/uL (ref 0.0–0.1)
EOS PCT: 0.8 % (ref 0.0–5.0)
Eosinophils Absolute: 0.1 10*3/uL (ref 0.0–0.7)
HEMATOCRIT: 37 % (ref 36.0–46.0)
Hemoglobin: 12.1 g/dL (ref 12.0–15.0)
LYMPHS PCT: 26.7 % (ref 12.0–46.0)
Lymphs Abs: 3.2 10*3/uL (ref 0.7–4.0)
MCHC: 32.8 g/dL (ref 30.0–36.0)
MCV: 98.1 fl (ref 78.0–100.0)
MONO ABS: 1 10*3/uL (ref 0.1–1.0)
MONOS PCT: 8.4 % (ref 3.0–12.0)
NEUTROS PCT: 63.7 % (ref 43.0–77.0)
Neutro Abs: 7.7 10*3/uL (ref 1.4–7.7)
Platelets: 321 10*3/uL (ref 150.0–400.0)
RBC: 3.77 Mil/uL — AB (ref 3.87–5.11)
RDW: 13.5 % (ref 11.5–15.5)
WBC: 12.1 10*3/uL — AB (ref 4.0–10.5)

## 2017-03-24 ENCOUNTER — Encounter: Payer: Self-pay | Admitting: Internal Medicine

## 2017-03-24 ENCOUNTER — Other Ambulatory Visit: Payer: BC Managed Care – PPO

## 2017-03-24 ENCOUNTER — Ambulatory Visit (INDEPENDENT_AMBULATORY_CARE_PROVIDER_SITE_OTHER): Payer: BC Managed Care – PPO | Admitting: Internal Medicine

## 2017-03-24 VITALS — BP 144/76 | HR 76 | Ht 67.0 in | Wt 163.8 lb

## 2017-03-24 DIAGNOSIS — K50111 Crohn's disease of large intestine with rectal bleeding: Secondary | ICD-10-CM

## 2017-03-24 DIAGNOSIS — Z9229 Personal history of other drug therapy: Secondary | ICD-10-CM

## 2017-03-24 DIAGNOSIS — K5010 Crohn's disease of large intestine without complications: Secondary | ICD-10-CM

## 2017-03-24 DIAGNOSIS — Z79899 Other long term (current) drug therapy: Secondary | ICD-10-CM

## 2017-03-24 MED ORDER — MESALAMINE 1.2 G PO TBEC: 4.8000 g | DELAYED_RELEASE_TABLET | Freq: Every day | ORAL | 4 refills | Status: DC

## 2017-03-24 NOTE — Progress Notes (Signed)
Subjective:    Patient ID: Julie Diaz, female    DOB: 11-28-1970, 46 y.o.   MRN: [id number]  HPI Julie Diaz is a 46 year old female with colonic Crohn's diagnosed in 2001 is here for follow-up. Last seen in the office in November 2017. That time it was 2 months after her colonoscopy where she was found to have mild to moderate Crohn's colitis. Biopsies were negative for dysplasia. She had been maintained on Lialda 4.8 g daily and azathioprine was added. She initially took 50 mg and worked up to 150 mg as a weight-based dose. Initially her blood work was stable but she failed to return for subsequent lab monitoring and the medication was not renewed. She was unaware of the reason as to why we failed to renew it.  Once azathioprine stop symptoms became again troublesome. She was having intermittent abdominal pain mid and lower cramping. Loose stools with blood. She also noticed some joint discomfort. We resumed azathioprine with lab monitoring about 6 weeks ago. We also treated her with Uceris 9 mg daily  She reports she is feeling to [place] better. She's had complete resolution of abdominal pain, loose stools and rectal bleeding. Stools of been formed occurring daily to every other day. No blood per rectum, melena. No further abdominal pain. Good appetite. No nausea or vomiting. No joint pains. No rashes. No fevers or chills   Review of Systems As per HPI, otherwise negative  Current Medications, Allergies, Past Medical History, Past Surgical History, Family History and Social History were reviewed in [employer] record.     Objective:   Physical Exam BP (!) 144/76   Pulse 76   Ht 5' 7"  (1.702 m)   Wt 163 lb 12.8 oz (74.3 kg)   LMP 12/22/2016 (Approximate) Comment: on birth control  BMI 25.65 kg/m  Constitutional: Well-developed and well-nourished. No distress. HEENT: Normocephalic and atraumatic. Oropharynx is clear and moist. Conjunctivae are normal.  No  scleral icterus. Neck: Neck supple. Trachea midline. Cardiovascular: Normal rate, regular rhythm and intact distal pulses. No M/R/G Pulmonary/chest: Effort normal and breath sounds normal. No wheezing, rales or rhonchi. Abdominal: Soft, nontender, nondistended. Bowel sounds active throughout. There are no masses palpable. No hepatosplenomegaly. Extremities: no clubbing, cyanosis, or edema Neurological: Alert and oriented to person place and time. Skin: Skin is warm and dry. Psychiatric: Normal mood and affect. Behavior is normal.  CBC    Component Value Date/Time   WBC 12.1 (H) 02/25/2017 0913   RBC 3.77 (L) 02/25/2017 0913   HGB 12.1 02/25/2017 0913   HCT 37.0 02/25/2017 0913   PLT 321.0 02/25/2017 0913   MCV 98.1 02/25/2017 0913   MCHC 32.8 02/25/2017 0913   RDW 13.5 02/25/2017 0913   LYMPHSABS 3.2 02/25/2017 0913   MONOABS 1.0 02/25/2017 0913   EOSABS 0.1 02/25/2017 0913   BASOSABS 0.0 02/25/2017 0913   CMP     Component Value Date/Time   NA 139 02/25/2017 0913   K 3.3 (L) 02/25/2017 0913   CL 103 02/25/2017 0913   CO2 30 02/25/2017 0913   GLUCOSE 99 02/25/2017 0913   BUN 14 02/25/2017 0913   CREATININE 0.75 02/25/2017 0913   CALCIUM 10.0 02/25/2017 0913   PROT 7.1 02/25/2017 0913   PROT 7.1 02/25/2017 0913   ALBUMIN 4.3 02/25/2017 0913   ALBUMIN 4.3 02/25/2017 0913   AST 13 02/25/2017 0913   AST 13 02/25/2017 0913   ALT 12 02/25/2017 0913   ALT 12 02/25/2017 0913  ALKPHOS 64 02/25/2017 0913   ALKPHOS 64 02/25/2017 0913   BILITOT 0.6 02/25/2017 0913   BILITOT 0.6 02/25/2017 0913       Assessment & Plan:  46 year old female with colonic Crohn's diagnosed in 2001 is here for follow-up.  1. Crohn's colitis with rectal bleeding -- she has had a response to Uceris and azathioprine added to Lialda. I will have her complete 4 additional weeks of Uceris 9 mg daily, which is 8 weeks total. After this discontinue Uceris altogether. Continue azathioprine 150 mg daily.  Continue Lialda 4.8 g daily. I will check a thiopurine metabolite panel along with CBC and CMP today. She understands that she will need liver enzymes and blood counts monitored routinely every 3-6 months while on azathioprine. Surveillance colonoscopy will be due about 2 years from her last which would be around September 2019  25 minutes spent with the patient today. Greater than 50% was spent in counseling and coordination of care with the patient

## 2017-03-24 NOTE — Patient Instructions (Signed)
We have sent the following medications to your pharmacy for you to pick up at your convenience: CBC, CMP, thiopurine metabolite  Continue azathioprine 150 mg daily.  Continue Uceris 9 mg x 1 more month then discontinue.  Continue Lialda 4.8 mg daily.  Please follow up with Dr Hilarie Fredrickson in 6 months.  If you are age 47 or older, your body mass index should be between 23-30. Your Body mass index is 25.65 kg/m. If this is out of the aforementioned range listed, please consider follow up with your Primary Care Provider.  If you are age 52 or younger, your body mass index should be between 19-25. Your Body mass index is 25.65 kg/m. If this is out of the aformentioned range listed, please consider follow up with your Primary Care Provider.

## 2017-03-31 LAB — THIOPURINE METABOLITES
6 MMPN METABOLITE: 5940 pmol/8x 10E8
6 TGN METABOLITE: 139 pmol/8x 10E8

## 2017-03-31 LAB — SERIAL MONITORING

## 2017-06-10 ENCOUNTER — Telehealth: Payer: Self-pay | Admitting: Internal Medicine

## 2017-06-10 MED ORDER — AZATHIOPRINE 50 MG PO TABS
150.0000 mg | ORAL_TABLET | Freq: Every day | ORAL | 0 refills | Status: DC
Start: 2017-06-10 — End: 2017-07-15

## 2017-06-10 NOTE — Telephone Encounter (Signed)
Rx refilled x 1 month until she has CMP (was supposed to have cmp repeated around 03/2017 as per Dr Vena Rua request). Patient verbalizes understanding.

## 2017-06-15 ENCOUNTER — Other Ambulatory Visit (INDEPENDENT_AMBULATORY_CARE_PROVIDER_SITE_OTHER): Payer: BC Managed Care – PPO

## 2017-06-15 DIAGNOSIS — K5090 Crohn's disease, unspecified, without complications: Secondary | ICD-10-CM

## 2017-06-15 LAB — CBC WITH DIFFERENTIAL/PLATELET
BASOS PCT: 0.8 % (ref 0.0–3.0)
Basophils Absolute: 0.1 10*3/uL (ref 0.0–0.1)
EOS ABS: 0.2 10*3/uL (ref 0.0–0.7)
Eosinophils Relative: 1.9 % (ref 0.0–5.0)
HEMATOCRIT: 37.7 % (ref 36.0–46.0)
Hemoglobin: 12.4 g/dL (ref 12.0–15.0)
LYMPHS ABS: 2.3 10*3/uL (ref 0.7–4.0)
LYMPHS PCT: 26.9 % (ref 12.0–46.0)
MCHC: 32.8 g/dL (ref 30.0–36.0)
MCV: 98.5 fl (ref 78.0–100.0)
MONOS PCT: 8.5 % (ref 3.0–12.0)
Monocytes Absolute: 0.7 10*3/uL (ref 0.1–1.0)
NEUTROS ABS: 5.2 10*3/uL (ref 1.4–7.7)
NEUTROS PCT: 61.9 % (ref 43.0–77.0)
PLATELETS: 372 10*3/uL (ref 150.0–400.0)
RBC: 3.82 Mil/uL — ABNORMAL LOW (ref 3.87–5.11)
RDW: 13.2 % (ref 11.5–15.5)
WBC: 8.5 10*3/uL (ref 4.0–10.5)

## 2017-06-15 LAB — COMPREHENSIVE METABOLIC PANEL
ALT: 14 U/L (ref 0–35)
AST: 15 U/L (ref 0–37)
Albumin: 4.1 g/dL (ref 3.5–5.2)
Alkaline Phosphatase: 60 U/L (ref 39–117)
BUN: 11 mg/dL (ref 6–23)
CALCIUM: 10.5 mg/dL (ref 8.4–10.5)
CHLORIDE: 104 meq/L (ref 96–112)
CO2: 28 meq/L (ref 19–32)
CREATININE: 0.71 mg/dL (ref 0.40–1.20)
GFR: 93.77 mL/min (ref >60.00)
GLUCOSE: 133 mg/dL — AB (ref 70–99)
Potassium: 3.9 mEq/L (ref 3.5–5.1)
SODIUM: 139 meq/L (ref 135–145)
Total Bilirubin: 0.5 mg/dL (ref 0.2–1.2)
Total Protein: 7.2 g/dL (ref 6.0–8.3)

## 2017-06-16 ENCOUNTER — Other Ambulatory Visit: Payer: Self-pay

## 2017-06-16 DIAGNOSIS — K529 Noninfective gastroenteritis and colitis, unspecified: Secondary | ICD-10-CM

## 2017-07-13 ENCOUNTER — Other Ambulatory Visit: Payer: Self-pay | Admitting: Internal Medicine

## 2017-07-14 ENCOUNTER — Telehealth: Payer: Self-pay | Admitting: Internal Medicine

## 2017-07-15 MED ORDER — AZATHIOPRINE 50 MG PO TABS: 150.0000 mg | ORAL_TABLET | Freq: Every day | ORAL | 0 refills | Status: DC

## 2017-07-15 NOTE — Telephone Encounter (Signed)
Rx sent 

## 2017-07-27 ENCOUNTER — Telehealth: Payer: Self-pay | Admitting: Internal Medicine

## 2017-07-27 MED ORDER — AZATHIOPRINE 50 MG PO TABS: 150.0000 mg | ORAL_TABLET | Freq: Every day | ORAL | 3 refills | Status: DC

## 2017-07-27 NOTE — Telephone Encounter (Signed)
Spoke with Julie Diaz and let her know she is due for labs in November. Julie Diaz scheduled to see Dr. Hilarie Fredrickson 09/27/17@2pm , Julie Diaz aware of appt.

## 2017-08-31 ENCOUNTER — Other Ambulatory Visit: Payer: Self-pay | Admitting: Internal Medicine

## 2017-09-13 ENCOUNTER — Other Ambulatory Visit (INDEPENDENT_AMBULATORY_CARE_PROVIDER_SITE_OTHER): Payer: BC Managed Care – PPO

## 2017-09-13 ENCOUNTER — Other Ambulatory Visit: Payer: Self-pay

## 2017-09-13 DIAGNOSIS — K529 Noninfective gastroenteritis and colitis, unspecified: Secondary | ICD-10-CM | POA: Diagnosis not present

## 2017-09-13 DIAGNOSIS — K50119 Crohn's disease of large intestine with unspecified complications: Secondary | ICD-10-CM

## 2017-09-13 LAB — COMPREHENSIVE METABOLIC PANEL
ALBUMIN: 4.4 g/dL (ref 3.5–5.2)
ALK PHOS: 61 U/L (ref 39–117)
ALT: 15 U/L (ref 0–35)
AST: 15 U/L (ref 0–37)
BILIRUBIN TOTAL: 0.5 mg/dL (ref 0.2–1.2)
BUN: 11 mg/dL (ref 6–23)
CO2: 30 mEq/L (ref 19–32)
Calcium: 10.4 mg/dL (ref 8.4–10.5)
Chloride: 104 mEq/L (ref 96–112)
Creatinine, Ser: 0.63 mg/dL (ref 0.40–1.20)
GFR: 107.53 mL/min (ref >60.00)
Glucose, Bld: 109 mg/dL — ABNORMAL HIGH (ref 70–99)
POTASSIUM: 3.8 meq/L (ref 3.5–5.1)
SODIUM: 139 meq/L (ref 135–145)
TOTAL PROTEIN: 7.3 g/dL (ref 6.0–8.3)

## 2017-09-13 LAB — CBC WITH DIFFERENTIAL/PLATELET
BASOS ABS: 0.1 10*3/uL (ref 0.0–0.1)
Basophils Relative: 0.8 % (ref 0.0–3.0)
EOS PCT: 2.6 % (ref 0.0–5.0)
Eosinophils Absolute: 0.2 10*3/uL (ref 0.0–0.7)
HCT: 39.6 % (ref 36.0–46.0)
HEMOGLOBIN: 13.1 g/dL (ref 12.0–15.0)
Lymphocytes Relative: 35.1 % (ref 12.0–46.0)
Lymphs Abs: 2.4 10*3/uL (ref 0.7–4.0)
MCHC: 33.1 g/dL (ref 30.0–36.0)
MCV: 97 fl (ref 78.0–100.0)
Monocytes Absolute: 0.6 10*3/uL (ref 0.1–1.0)
Monocytes Relative: 8.3 % (ref 3.0–12.0)
NEUTROS PCT: 53.2 % (ref 43.0–77.0)
Neutro Abs: 3.7 10*3/uL (ref 1.4–7.7)
Platelets: 326 10*3/uL (ref 150.0–400.0)
RBC: 4.08 Mil/uL (ref 3.87–5.11)
RDW: 13.9 % (ref 11.5–15.5)
WBC: 7 10*3/uL (ref 4.0–10.5)

## 2017-09-27 ENCOUNTER — Ambulatory Visit: Payer: BC Managed Care – PPO | Admitting: Internal Medicine

## 2017-09-27 ENCOUNTER — Encounter: Payer: Self-pay | Admitting: Internal Medicine

## 2017-09-27 VITALS — BP 136/68 | HR 87 | Ht 67.0 in | Wt 161.0 lb

## 2017-09-27 DIAGNOSIS — K5010 Crohn's disease of large intestine without complications: Secondary | ICD-10-CM

## 2017-09-27 DIAGNOSIS — Z79899 Other long term (current) drug therapy: Secondary | ICD-10-CM | POA: Diagnosis not present

## 2017-09-27 MED ORDER — LIALDA 1.2 G PO TBEC: DELAYED_RELEASE_TABLET | ORAL | 6 refills | Status: AC

## 2017-09-27 MED ORDER — AZATHIOPRINE 50 MG PO TABS: 150.0000 mg | ORAL_TABLET | Freq: Every day | ORAL | 6 refills | Status: AC

## 2017-09-27 NOTE — Patient Instructions (Signed)
We have sent the following medications to your pharmacy for you to pick up at your convenience: Azathioprine 150 mg daily Lialda 4.8 g daily  Please follow up with Dr Hilarie Fredrickson in August 2018.  If you are age 47 or older, your body mass index should be between 23-30. Your Body mass index is 25.22 kg/m. If this is out of the aforementioned range listed, please consider follow up with your Primary Care Provider.  If you are age 70 or younger, your body mass index should be between 19-25. Your Body mass index is 25.22 kg/m. If this is out of the aformentioned range listed, please consider follow up with your Primary Care Provider.

## 2017-09-27 NOTE — Progress Notes (Signed)
Subjective:    Patient ID: Julie Diaz, female    DOB: Nov 23, 1970, 46 y.o.   MRN: [id number]  HPI Julie Diaz is a 46 year old female with a history of colonic Crohn's diagnosed in 2001 who is here for follow-up.  She was last seen in the office on 03/24/2017.  She has been maintained on Lialda and azathioprine.  She reports that she has been doing extremely well recently.  She denies all symptoms of active colitis.  No belly pain, no tenesmus, no blood in her stool or melena.  Stools have been formed without diarrhea or constipation.  No oral ulcers, rashes, ocular complaint or new joint pains.  She has been taking Lialda 4.8 g daily and azathioprine 150 mg daily.  She recently had labs revealing a normal hemoglobin, normal white count and normal liver enzymes.  Review of Systems As per HPI, otherwise negative  Current Medications, Allergies, Past Medical History, Past Surgical History, Family History and Social History were reviewed in [employer] record.     Objective:   Physical Exam BP 136/68   Pulse 87   Ht 5' 7"  (1.702 m)   Wt 161 lb (73 kg)   BMI 25.22 kg/m  Constitutional: Well-developed and well-nourished. No distress. HEENT: Normocephalic and atraumatic.  Conjunctivae are normal.  No scleral icterus. Neck: Neck supple. Trachea midline. Cardiovascular: Normal rate, regular rhythm and intact distal pulses. No M/R/G Pulmonary/chest: Effort normal and breath sounds normal. No wheezing, rales or rhonchi. Abdominal: Soft, nontender, nondistended. Bowel sounds active throughout. There are no masses palpable. No hepatosplenomegaly. Extremities: no clubbing, cyanosis, or edema Neurological: Alert and oriented to person place and time. Skin: Skin is warm and dry. Psychiatric: Normal mood and affect. Behavior is normal.  CBC    Component Value Date/Time   WBC 7.0 09/13/2017 1116   RBC 4.08 09/13/2017 1116   HGB 13.1 09/13/2017 1116   HCT 39.6  09/13/2017 1116   PLT 326.0 09/13/2017 1116   MCV 97.0 09/13/2017 1116   MCHC 33.1 09/13/2017 1116   RDW 13.9 09/13/2017 1116   LYMPHSABS 2.4 09/13/2017 1116   MONOABS 0.6 09/13/2017 1116   EOSABS 0.2 09/13/2017 1116   BASOSABS 0.1 09/13/2017 1116   CMP     Component Value Date/Time   NA 139 09/13/2017 1116   K 3.8 09/13/2017 1116   CL 104 09/13/2017 1116   CO2 30 09/13/2017 1116   GLUCOSE 109 (H) 09/13/2017 1116   BUN 11 09/13/2017 1116   CREATININE 0.63 09/13/2017 1116   CALCIUM 10.4 09/13/2017 1116   PROT 7.3 09/13/2017 1116   ALBUMIN 4.4 09/13/2017 1116   AST 15 09/13/2017 1116   ALT 15 09/13/2017 1116   ALKPHOS 61 09/13/2017 1116   BILITOT 0.5 09/13/2017 1116      Assessment & Plan:  46 year old female with a history of colonic Crohn's diagnosed in 2001 who is here for follow-up  1.  Crohn's colitis --she is doing well and has reached clinical remission.  She has been off steroids for nearly 6 months though did respond to Uceris when used.  Right now we will continue her on Lialda 4.8 g daily and azathioprine 150 mg daily.  Her liver enzymes and white count are normal on her current dose.  He will be due surveillance colonoscopy in September 2019.  I will see her back several months before September 2019 for follow-up and to arrange this colonoscopy. --She is previously received Pneumovax and already had flu  vaccine this year  15 minutes spent with the patient today. Greater than 50% was spent in counseling and coordination of care with the patient

## 2017-11-16 ENCOUNTER — Telehealth: Payer: Self-pay | Admitting: Internal Medicine

## 2017-11-16 NOTE — Telephone Encounter (Signed)
Left message advising patient that it may be easier for her to have the prescription we wrote for these medications transferred to new pharmacy once she moves. She should still have 6 refills left (this would eliminate the chance of her losing paper script). I have advised that I am glad to write a written script if she still chooses though.

## 2017-12-08 ENCOUNTER — Other Ambulatory Visit (INDEPENDENT_AMBULATORY_CARE_PROVIDER_SITE_OTHER): Payer: BC Managed Care – PPO

## 2017-12-08 DIAGNOSIS — K50119 Crohn's disease of large intestine with unspecified complications: Secondary | ICD-10-CM

## 2017-12-08 LAB — COMPREHENSIVE METABOLIC PANEL
ALT: 27 U/L (ref 0–35)
AST: 16 U/L (ref 0–37)
Albumin: 4.2 g/dL (ref 3.5–5.2)
Alkaline Phosphatase: 55 U/L (ref 39–117)
BILIRUBIN TOTAL: 0.6 mg/dL (ref 0.2–1.2)
BUN: 10 mg/dL (ref 6–23)
CO2: 32 mEq/L (ref 19–32)
CREATININE: 0.71 mg/dL (ref 0.40–1.20)
Calcium: 10.1 mg/dL (ref 8.4–10.5)
Chloride: 104 mEq/L (ref 96–112)
GFR: 93.58 mL/min (ref >60.00)
GLUCOSE: 113 mg/dL — AB (ref 70–99)
Potassium: 3.4 mEq/L — ABNORMAL LOW (ref 3.5–5.1)
Sodium: 142 mEq/L (ref 135–145)
Total Protein: 7 g/dL (ref 6.0–8.3)

## 2017-12-08 LAB — CBC WITH DIFFERENTIAL/PLATELET
BASOS ABS: 0.1 10*3/uL (ref 0.0–0.1)
Basophils Relative: 0.7 % (ref 0.0–3.0)
EOS ABS: 0.1 10*3/uL (ref 0.0–0.7)
Eosinophils Relative: 1.5 % (ref 0.0–5.0)
HCT: 38.8 % (ref 36.0–46.0)
Hemoglobin: 13 g/dL (ref 12.0–15.0)
LYMPHS ABS: 2.6 10*3/uL (ref 0.7–4.0)
Lymphocytes Relative: 30.9 % (ref 12.0–46.0)
MCHC: 33.4 g/dL (ref 30.0–36.0)
MCV: 96.3 fl (ref 78.0–100.0)
Monocytes Absolute: 0.7 10*3/uL (ref 0.1–1.0)
Monocytes Relative: 8.3 % (ref 3.0–12.0)
NEUTROS ABS: 4.9 10*3/uL (ref 1.4–7.7)
NEUTROS PCT: 58.6 % (ref 43.0–77.0)
PLATELETS: 357 10*3/uL (ref 150.0–400.0)
RBC: 4.03 Mil/uL (ref 3.87–5.11)
RDW: 14 % (ref 11.5–15.5)
WBC: 8.3 10*3/uL (ref 4.0–10.5)

## 2017-12-16 ENCOUNTER — Encounter: Payer: Self-pay | Admitting: Internal Medicine

## 2018-12-21 ENCOUNTER — Encounter: Payer: Self-pay | Admitting: Internal Medicine
